# Patient Record
Sex: Female | Born: 1990 | Race: Black or African American | Hispanic: No | Marital: Single | Smoking: Never smoker
Health system: Southern US, Community
[De-identification: ages and names within clinical notes are randomized; demographics above are authoritative.]

## PROBLEM LIST (undated history)

## (undated) DIAGNOSIS — K219 Gastro-esophageal reflux disease without esophagitis: Secondary | ICD-10-CM

## (undated) DIAGNOSIS — F259 Schizoaffective disorder, unspecified: Secondary | ICD-10-CM

## (undated) DIAGNOSIS — F79 Unspecified intellectual disabilities: Secondary | ICD-10-CM

## (undated) HISTORY — PX: CHOLECYSTECTOMY: SHX55

---
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
## 2012-08-21 ENCOUNTER — Encounter (HOSPITAL_BASED_OUTPATIENT_CLINIC_OR_DEPARTMENT_OTHER): Payer: Self-pay | Admitting: *Deleted

## 2012-08-21 ENCOUNTER — Emergency Department (HOSPITAL_BASED_OUTPATIENT_CLINIC_OR_DEPARTMENT_OTHER)
Admission: EM | Admit: 2012-08-21 | Discharge: 2012-08-21 | Disposition: A | Discharge status: Home / Self Care | Payer: Medicaid Other | Attending: Emergency Medicine | Admitting: Emergency Medicine

## 2012-08-21 DIAGNOSIS — F259 Schizoaffective disorder, unspecified: Secondary | ICD-10-CM | POA: Insufficient documentation

## 2012-08-21 DIAGNOSIS — IMO0002 Reserved for concepts with insufficient information to code with codable children: Secondary | ICD-10-CM | POA: Insufficient documentation

## 2012-08-21 DIAGNOSIS — Y929 Unspecified place or not applicable: Secondary | ICD-10-CM | POA: Insufficient documentation

## 2012-08-21 DIAGNOSIS — Y9389 Activity, other specified: Secondary | ICD-10-CM | POA: Insufficient documentation

## 2012-08-21 DIAGNOSIS — T162XXA Foreign body in left ear, initial encounter: Secondary | ICD-10-CM

## 2012-08-21 DIAGNOSIS — F79 Unspecified intellectual disabilities: Secondary | ICD-10-CM | POA: Insufficient documentation

## 2012-08-21 DIAGNOSIS — Z79899 Other long term (current) drug therapy: Secondary | ICD-10-CM | POA: Insufficient documentation

## 2012-08-21 DIAGNOSIS — T169XXA Foreign body in ear, unspecified ear, initial encounter: Secondary | ICD-10-CM | POA: Insufficient documentation

## 2012-08-21 HISTORY — DX: Schizoaffective disorder, unspecified: F25.9

## 2012-08-21 HISTORY — DX: Unspecified intellectual disabilities: F79

## 2012-08-21 MED ORDER — LIDOCAINE HCL (CARDIAC) 20 MG/ML IV SOLN
INTRAVENOUS | Status: AC
  Administered 2012-08-21
  Filled 2012-08-21: qty 5

## 2012-08-21 NOTE — ED Notes (Signed)
Thinks she has a bug in her left ear.

## 2012-08-21 NOTE — ED Notes (Signed)
FB insect roach removed from left ear using suction device, pt denied pain or discomfort and insect removed w/o difficult pt remains pain free and exam of ear canal completed and no debris trauma or abnormality noted

## 2012-08-22 NOTE — ED Provider Notes (Signed)
History    CSN: [id number] Arrival date & time 08/21/12  2245  First MD Initiated Contact with Patient 08/21/12 2255     Chief Complaint  Patient presents with  . Foreign Body in Ear   (Consider location/radiation/quality/duration/timing/severity/associated sxs/prior Treatment) HPI Comments: Patient presents emergency department with chief complaint of "bug in the ear." She states that she can hear across and around. She denies any pain. She has not tried anything to alleviate her symptoms. Nothing makes his symptoms better or worse.  The history is provided by the patient. No language interpreter was used.   Past Medical History  Diagnosis Date  . Schizo-affective psychosis   . MR (mental retardation)    History reviewed. No pertinent past surgical history. No family history on file. History  Substance Use Topics  . Smoking status: Never Smoker   . Smokeless tobacco: Not on file  . Alcohol Use: No   OB History   Grav Para Term Preterm Abortions TAB SAB Ect Mult Living                 Review of Systems  All other systems reviewed and are negative.    Allergies  Review of patient's allergies indicates no known allergies.  Home Medications   Current Outpatient Rx  Name  Route  Sig  Dispense  Refill  . ARIPiprazole (ABILIFY PO)   Oral   Take by mouth.         Marland Kitchen FLUoxetine (PROZAC) 20 MG capsule   Oral   Take 20 mg by mouth daily.          BP 119/71  Pulse 72  Temp(Src) 98 F (36.7 C) (Oral)  Resp 20  Wt 250 lb (113.399 kg)  SpO2 99% Physical Exam  Nursing note and vitals reviewed. Constitutional: She is oriented to person, place, and time. She appears well-developed and well-nourished.  HENT:  Head: Normocephalic and atraumatic.  Foreign body is observed in left ear  Eyes: Conjunctivae and EOM are normal.  Neck: Normal range of motion.  Cardiovascular: Normal rate.   Pulmonary/Chest: Effort normal.  Abdominal: She exhibits no distension.   Musculoskeletal: Normal range of motion.  Neurological: She is alert and oriented to person, place, and time.  Skin: Skin is dry.  Psychiatric: She has a normal mood and affect. Her behavior is normal. Judgment and thought content normal.    ED Course  FOREIGN BODY REMOVAL Date/Time: 08/22/2012 12:45 AM Performed by: Roxy Horseman Authorized by: Roxy Horseman Consent: Verbal consent obtained. Risks and benefits: risks, benefits and alternatives were discussed Consent given by: patient Patient understanding: patient states understanding of the procedure being performed Patient consent: the patient's understanding of the procedure matches consent given Procedure consent: procedure consent matches procedure scheduled Relevant documents: relevant documents present and verified Test results: test results available and properly labeled Site marked: the operative site was marked Imaging studies: imaging studies available Required items: required blood products, implants, devices, and special equipment available Patient identity confirmed: verbally with patient Body area: ear Location details: left ear Patient sedated: no Patient restrained: no Patient cooperative: yes Localization method: visualized Removal mechanism: suction Complexity: simple 1 objects recovered. Objects recovered: Cock roach Post-procedure assessment: foreign body removed Patient tolerance: Patient tolerated the procedure well with no immediate complications.   (including critical care time)  Following foreign body removal, the ear was again inspected, and there is no evidence of any injury.  Labs Reviewed - No data to display No  results found. 1. Foreign body in ear, left, initial encounter     MDM  Patient with insect in her left ear. The bug was removed without complication. There is no damage to the tympanic membrane. Patient is stable and ready for discharge.  Roxy Horseman, PA-C 08/22/12  [phone]

## 2012-08-22 NOTE — ED Provider Notes (Signed)
Medical screening examination/treatment/procedure(s) were performed by non-physician practitioner and as supervising physician I was immediately available for consultation/collaboration.   Joya Gaskins, MD 08/22/12 0230

## 2013-10-14 ENCOUNTER — Encounter (HOSPITAL_BASED_OUTPATIENT_CLINIC_OR_DEPARTMENT_OTHER): Payer: Self-pay | Admitting: Emergency Medicine

## 2013-10-14 ENCOUNTER — Emergency Department (HOSPITAL_BASED_OUTPATIENT_CLINIC_OR_DEPARTMENT_OTHER): Payer: Federal, State, Local not specified - PPO

## 2013-10-14 ENCOUNTER — Emergency Department (HOSPITAL_BASED_OUTPATIENT_CLINIC_OR_DEPARTMENT_OTHER)
Admission: EM | Admit: 2013-10-14 | Discharge: 2013-10-14 | Disposition: A | Discharge status: Home / Self Care | Payer: Federal, State, Local not specified - PPO | Attending: Emergency Medicine | Admitting: Emergency Medicine

## 2013-10-14 DIAGNOSIS — F259 Schizoaffective disorder, unspecified: Secondary | ICD-10-CM | POA: Insufficient documentation

## 2013-10-14 DIAGNOSIS — R079 Chest pain, unspecified: Secondary | ICD-10-CM | POA: Insufficient documentation

## 2013-10-14 DIAGNOSIS — R0789 Other chest pain: Secondary | ICD-10-CM

## 2013-10-14 DIAGNOSIS — Z79899 Other long term (current) drug therapy: Secondary | ICD-10-CM | POA: Diagnosis not present

## 2013-10-14 LAB — COMPREHENSIVE METABOLIC PANEL
ALBUMIN: 3.5 g/dL (ref 3.5–5.2)
ALT: 11 U/L (ref 0–35)
AST: 15 U/L (ref 0–37)
Alkaline Phosphatase: 80 U/L (ref 39–117)
Anion gap: 12 (ref 5–15)
BUN: 8 mg/dL (ref 6–23)
CALCIUM: 9.6 mg/dL (ref 8.4–10.5)
CO2: 26 mEq/L (ref 19–32)
Chloride: 100 mEq/L (ref 96–112)
Creatinine, Ser: 0.8 mg/dL (ref 0.50–1.10)
GFR calc Af Amer: >90 mL/min (ref >90)
GFR calc non Af Amer: >90 mL/min (ref >90)
Glucose, Bld: 88 mg/dL (ref 70–99)
Potassium: 4 mEq/L (ref 3.7–5.3)
SODIUM: 138 meq/L (ref 137–147)
TOTAL PROTEIN: 7.5 g/dL (ref 6.0–8.3)
Total Bilirubin: 0.5 mg/dL (ref 0.3–1.2)

## 2013-10-14 LAB — CBC WITH DIFFERENTIAL/PLATELET
BASOS ABS: 0.1 10*3/uL (ref 0.0–0.1)
BASOS PCT: 1 % (ref 0–1)
EOS ABS: 0.2 10*3/uL (ref 0.0–0.7)
EOS PCT: 3 % (ref 0–5)
HCT: 36 % (ref 36.0–46.0)
Hemoglobin: 11.9 g/dL — ABNORMAL LOW (ref 12.0–15.0)
LYMPHS PCT: 34 % (ref 12–46)
Lymphs Abs: 2.6 10*3/uL (ref 0.7–4.0)
MCH: 28.2 pg (ref 26.0–34.0)
MCHC: 33.1 g/dL (ref 30.0–36.0)
MCV: 85.3 fL (ref 78.0–100.0)
Monocytes Absolute: 0.5 10*3/uL (ref 0.1–1.0)
Monocytes Relative: 7 % (ref 3–12)
Neutro Abs: 4.3 10*3/uL (ref 1.7–7.7)
Neutrophils Relative %: 55 % (ref 43–77)
PLATELETS: 301 10*3/uL (ref 150–400)
RBC: 4.22 MIL/uL (ref 3.87–5.11)
RDW: 12.7 % (ref 11.5–15.5)
WBC: 7.7 10*3/uL (ref 4.0–10.5)

## 2013-10-14 LAB — TROPONIN I: Troponin I: <0.3 ng/mL (ref <0.30)

## 2013-10-14 NOTE — ED Provider Notes (Signed)
CSN: [id number]     Arrival date & time 10/14/13  1141 History   First MD Initiated Contact with Patient 10/14/13 1207     Chief Complaint  Patient presents with  . Chest Pain     (Consider location/radiation/quality/duration/timing/severity/associated sxs/prior Treatment) Patient is a 23 y.o. female presenting with chest pain. The history is provided by the patient. A language interpreter was used.  Chest Pain Pain location:  L chest Pain quality: aching   Pain quality: not tearing and no tightness   Pain radiates to:  Does not radiate Pain radiates to the back: no   Pain severity:  Moderate Duration:  4 hours Timing:  Intermittent Progression:  Resolved Chronicity:  New Relieved by:  Nothing Worsened by:  Nothing tried Ineffective treatments:  None tried Associated symptoms: no headache and not vomiting     Past Medical History  Diagnosis Date  . Schizo-affective psychosis   . MR (mental retardation)    History reviewed. No pertinent past surgical history. No family history on file. History  Substance Use Topics  . Smoking status: Never Smoker   . Smokeless tobacco: Not on file  . Alcohol Use: No   OB History   Grav Para Term Preterm Abortions TAB SAB Ect Mult Living                 Review of Systems  Cardiovascular: Positive for chest pain.  Gastrointestinal: Negative for vomiting.  Neurological: Negative for headaches.  All other systems reviewed and are negative.     Allergies  Review of patient's allergies indicates no known allergies.  Home Medications   Prior to Admission medications   Medication Sig Start Date End Date Taking? Authorizing Provider  ferrous sulfate 325 (65 FE) MG tablet Take 325 mg by mouth daily with breakfast.   Yes Historical Provider, MD  Vitamin D, Ergocalciferol, (DRISDOL) 50000 UNITS CAPS capsule Take 50,000 Units by mouth every 7 (seven) days.   Yes Historical Provider, MD  ARIPiprazole (ABILIFY PO) Take by mouth.     Historical Provider, MD  FLUoxetine (PROZAC) 20 MG capsule Take 20 mg by mouth daily.    Historical Provider, MD   BP 107/70  Pulse 61  Temp(Src) 98.1 F (36.7 C) (Oral)  Resp 18  Ht  (1.702 m)  Wt 278 lb (126.1 kg)  BMI 43.53 kg/m2  SpO2 98%  LMP 10/14/2013 Physical Exam  Nursing note and vitals reviewed. Constitutional: She is oriented to person, place, and time. She appears well-developed and well-nourished.  HENT:  Head: Normocephalic and atraumatic.  Eyes: EOM are normal. Pupils are equal, round, and reactive to light.  Neck: Normal range of motion.  Pulmonary/Chest: Effort normal.  Abdominal: Soft. She exhibits no distension.  Musculoskeletal: Normal range of motion.  Neurological: She is alert and oriented to person, place, and time.  Skin: Skin is warm and dry.  Psychiatric: She has a normal mood and affect.    ED Course  Procedures (including critical care time) Labs Review Labs Reviewed  CBC WITH DIFFERENTIAL - Abnormal; Notable for the following:    Hemoglobin 11.9 (*)    All other components within normal limits  TROPONIN I  COMPREHENSIVE METABOLIC PANEL    Imaging Review Dg Chest 2 View  10/14/2013   CLINICAL DATA:  Chest pain  EXAM: CHEST  2 VIEW  COMPARISON:  None.  FINDINGS: The heart size and mediastinal contours are within normal limits. Both lungs are clear. The visualized skeletal structures  are unremarkable.  IMPRESSION: No active cardiopulmonary disease.   Electronically Signed   By: Britta Mccreedy M.D.   On: 10/14/2013 13:15     EKG Interpretation   Date/Time:  Sunday October 14 2013 12:10:18 EDT Ventricular Rate:  57 PR Interval:  156 QRS Duration: 86 QT Interval:  452 QTC Calculation: 439 R Axis:   45 Text Interpretation:  Sinus bradycardia with sinus arrhythmia Nonspecific  T wave abnormality Abnormal ECG No previous ECGs available Confirmed by  Manus Gunning  MD, STEPHEN [phone]) on 10/14/2013 12:17:35 PM     Results for orders  placed during the hospital encounter of 10/14/13  TROPONIN I      Result Value Ref Range   Troponin I <0.30  <0.30 ng/mL  CBC WITH DIFFERENTIAL      Result Value Ref Range   WBC 7.7  4.0 - 10.5 K/uL   RBC 4.22  3.87 - 5.11 MIL/uL   Hemoglobin 11.9 (*) 12.0 - 15.0 g/dL   HCT 19.1  47.8 - 29.5 %   MCV 85.3  78.0 - 100.0 fL   MCH 28.2  26.0 - 34.0 pg   MCHC 33.1  30.0 - 36.0 g/dL   RDW 62.1  30.8 - 65.7 %   Platelets 301  150 - 400 K/uL   Neutrophils Relative % 55  43 - 77 %   Neutro Abs 4.3  1.7 - 7.7 K/uL   Lymphocytes Relative 34  12 - 46 %   Lymphs Abs 2.6  0.7 - 4.0 K/uL   Monocytes Relative 7  3 - 12 %   Monocytes Absolute 0.5  0.1 - 1.0 K/uL   Eosinophils Relative 3  0 - 5 %   Eosinophils Absolute 0.2  0.0 - 0.7 K/uL   Basophils Relative 1  0 - 1 %   Basophils Absolute 0.1  0.0 - 0.1 K/uL  COMPREHENSIVE METABOLIC PANEL      Result Value Ref Range   Sodium 138  137 - 147 mEq/L   Potassium 4.0  3.7 - 5.3 mEq/L   Chloride 100  96 - 112 mEq/L   CO2 26  19 - 32 mEq/L   Glucose, Bld 88  70 - 99 mg/dL   BUN 8  6 - 23 mg/dL   Creatinine, Ser 8.46  0.50 - 1.10 mg/dL   Calcium 9.6  8.4 - 96.2 mg/dL   Total Protein 7.5  6.0 - 8.3 g/dL   Albumin 3.5  3.5 - 5.2 g/dL   AST 15  0 - 37 U/L   ALT 11  0 - 35 U/L   Alkaline Phosphatase 80  39 - 117 U/L   Total Bilirubin 0.5  0.3 - 1.2 mg/dL   GFR calc non Af Amer >90  >90 mL/min   GFR calc Af Amer >90  >90 mL/min   Anion gap 12  5 - 15   Dg Chest 2 View  10/14/2013   CLINICAL DATA:  Chest pain  EXAM: CHEST  2 VIEW  COMPARISON:  None.  FINDINGS: The heart size and mediastinal contours are within normal limits. Both lungs are clear. The visualized skeletal structures are unremarkable.  IMPRESSION: No active cardiopulmonary disease.   Electronically Signed   By: Britta Mccreedy M.D.   On: 10/14/2013 13:15    MDM Pt is being seen by cardiology.  Pain is resolved.   Troponin negative,  I doubt cardiac etiology today   Final diagnoses:   Chest pain of  uncertain etiology    Pt advised tylenol for fever    Elson Areas, PA-C 10/14/13 1435

## 2013-10-14 NOTE — Discharge Instructions (Signed)

## 2013-10-14 NOTE — ED Provider Notes (Signed)
Medical screening examination/treatment/procedure(s) were performed by non-physician practitioner and as supervising physician I was immediately available for consultation/collaboration.   EKG Interpretation   Date/Time:  Sunday October 14 2013 12:10:18 EDT Ventricular Rate:  57 PR Interval:  156 QRS Duration: 86 QT Interval:  452 QTC Calculation: 439 R Axis:   45 Text Interpretation:  Sinus bradycardia with sinus arrhythmia Nonspecific  T wave abnormality Abnormal ECG No previous ECGs available Confirmed by  Manus Gunning  MD, Darwyn Ponzo [phone]) on 10/14/2013 12:17:35 PM        Glynn Octave, MD 10/14/13 [phone]

## 2013-10-14 NOTE — ED Notes (Signed)
Patient states that at appx 10am she began having chest pain, described as a burning sensation in her upper mid chest. Pt also c/o throat pain and burning that she states started at the same time.

## 2014-08-15 ENCOUNTER — Emergency Department (HOSPITAL_BASED_OUTPATIENT_CLINIC_OR_DEPARTMENT_OTHER): Payer: Medicaid Other

## 2014-08-15 ENCOUNTER — Encounter (HOSPITAL_BASED_OUTPATIENT_CLINIC_OR_DEPARTMENT_OTHER): Payer: Self-pay | Admitting: *Deleted

## 2014-08-15 ENCOUNTER — Emergency Department (HOSPITAL_BASED_OUTPATIENT_CLINIC_OR_DEPARTMENT_OTHER)
Admission: EM | Admit: 2014-08-15 | Discharge: 2014-08-15 | Disposition: A | Discharge status: Home / Self Care | Payer: Medicaid Other | Attending: Emergency Medicine | Admitting: Emergency Medicine

## 2014-08-15 DIAGNOSIS — R109 Unspecified abdominal pain: Secondary | ICD-10-CM | POA: Insufficient documentation

## 2014-08-15 DIAGNOSIS — Z79899 Other long term (current) drug therapy: Secondary | ICD-10-CM | POA: Diagnosis not present

## 2014-08-15 DIAGNOSIS — Z3202 Encounter for pregnancy test, result negative: Secondary | ICD-10-CM | POA: Diagnosis not present

## 2014-08-15 DIAGNOSIS — F79 Unspecified intellectual disabilities: Secondary | ICD-10-CM | POA: Insufficient documentation

## 2014-08-15 DIAGNOSIS — Z87442 Personal history of urinary calculi: Secondary | ICD-10-CM | POA: Insufficient documentation

## 2014-08-15 LAB — URINALYSIS, ROUTINE W REFLEX MICROSCOPIC
Bilirubin Urine: NEGATIVE
Glucose, UA: NEGATIVE mg/dL
KETONES UR: NEGATIVE mg/dL
LEUKOCYTES UA: NEGATIVE
NITRITE: NEGATIVE
PROTEIN: NEGATIVE mg/dL
SPECIFIC GRAVITY, URINE: 1.03 (ref 1.005–1.030)
Urobilinogen, UA: 1 mg/dL (ref 0.0–1.0)
pH: 6 (ref 5.0–8.0)

## 2014-08-15 LAB — PREGNANCY, URINE: Preg Test, Ur: NEGATIVE

## 2014-08-15 LAB — URINE MICROSCOPIC-ADD ON

## 2014-08-15 MED ORDER — ONDANSETRON 8 MG PO TBDP: 8.0000 mg | ORAL_TABLET | Freq: Three times a day (TID) | ORAL | Status: DC | PRN

## 2014-08-15 MED ORDER — HYDROCODONE-ACETAMINOPHEN 5-325 MG PO TABS: 1.0000 | ORAL_TABLET | Freq: Four times a day (QID) | ORAL | Status: DC | PRN

## 2014-08-15 NOTE — ED Notes (Signed)
Pt c/o right flank pain x 2 days.  

## 2014-08-15 NOTE — ED Notes (Signed)
Patient transported to CT condition stable

## 2014-08-15 NOTE — ED Provider Notes (Signed)
CSN: [id number]     Arrival date & time 08/15/14  0017 History   First MD Initiated Contact with Patient 08/15/14 [phone]     Chief Complaint  Patient presents with  . Flank Pain     (Consider location/radiation/quality/duration/timing/severity/associated sxs/prior Treatment) HPI  Level 5 Caveat: mentally retarded. This is a 24 year old female with a two-day history of right flank pain acutely worsened late yesterday evening. The pain was severe enough to have her crying at its worst. She also had nausea and vomiting associated with it. She describes the pain as feeling like her right flank is swollen. She has not taken any pain medication for this. Her pain is improved but is still present. She denies abdominal pain. She has a history of kidney stones.  Past Medical History  Diagnosis Date  . Schizo-affective psychosis   . MR (mental retardation)    History reviewed. No pertinent past surgical history. History reviewed. No pertinent family history. History  Substance Use Topics  . Smoking status: Never Smoker   . Smokeless tobacco: Not on file  . Alcohol Use: No   OB History    No data available     Review of Systems  Unable to perform ROS   Allergies  Review of patient's allergies indicates no known allergies.  Home Medications   Prior to Admission medications   Medication Sig Start Date End Date Taking? Authorizing Provider  ARIPiprazole (ABILIFY PO) Take by mouth.    Historical Provider, MD  ferrous sulfate 325 (65 FE) MG tablet Take 325 mg by mouth daily with breakfast.    Historical Provider, MD  FLUoxetine (PROZAC) 20 MG capsule Take 20 mg by mouth daily.    Historical Provider, MD  Vitamin D, Ergocalciferol, (DRISDOL) 50000 UNITS CAPS capsule Take 50,000 Units by mouth every 7 (seven) days.    Historical Provider, MD   BP 112/48 mmHg  Pulse 70  Temp(Src) 97.8 F (36.6 C) (Oral)  Resp 18  Ht  (1.676 m)  Wt 270 lb (122.471 kg)  BMI 43.60 kg/m2  SpO2 98%   LMP 07/27/2014   Physical Exam  General: Well-developed, well-nourished female in no acute distress; appearance consistent with age of record HENT: normocephalic; atraumatic Eyes: pupils equal, round and reactive to light; extraocular muscles intact Neck: supple Heart: regular rate and rhythm Lungs: clear to auscultation bilaterally Abdomen: soft; nondistended; nontender; bowel sounds present GU: No CVA tenderness Extremities: No deformity; full range of motion; pulses normal Neurologic: Awake, alert; motor function intact in all extremities and symmetric; no facial droop Skin: Warm and dry Psychiatric: Normal mood and affect    ED Course  Procedures (including critical care time)   MDM   Nursing notes and vitals signs, including pulse oximetry, reviewed.  Summary of this visit's results, reviewed by myself:  Labs:  Results for orders placed or performed during the hospital encounter of 08/15/14 (from the past 24 hour(s))  Pregnancy, urine     Status: None   Collection Time: 08/15/14 12:30 AM  Result Value Ref Range   Preg Test, Ur NEGATIVE NEGATIVE  Urinalysis, Routine w reflex microscopic (not at [facility])     Status: Abnormal   Collection Time: 08/15/14 12:30 AM  Result Value Ref Range   Color, Urine YELLOW YELLOW   APPearance CLOUDY (A) CLEAR   Specific Gravity, Urine 1.030 1.005 - 1.030   pH 6.0 5.0 - 8.0   Glucose, UA NEGATIVE NEGATIVE mg/dL   Hgb urine dipstick SMALL (A)  NEGATIVE   Bilirubin Urine NEGATIVE NEGATIVE   Ketones, ur NEGATIVE NEGATIVE mg/dL   Protein, ur NEGATIVE NEGATIVE mg/dL   Urobilinogen, UA 1.0 0.0 - 1.0 mg/dL   Nitrite NEGATIVE NEGATIVE   Leukocytes, UA NEGATIVE NEGATIVE  Urine microscopic-add on     Status: Abnormal   Collection Time: 08/15/14 12:30 AM  Result Value Ref Range   Squamous Epithelial / LPF FEW (A) RARE   WBC, UA 3-6 <3 WBC/hpf   RBC / HPF 7-10 <3 RBC/hpf   Bacteria, UA MANY (A) RARE   Urine-Other MUCOUS PRESENT      Imaging Studies: Ct Renal Stone Study  08/15/2014   CLINICAL DATA:  Right flank pain for 2 days.  EXAM: CT ABDOMEN AND PELVIS WITHOUT CONTRAST  TECHNIQUE: Multidetector CT imaging of the abdomen and pelvis was performed following the standard protocol without IV contrast.  COMPARISON:  04/01/2014  FINDINGS: Lung bases are clear.  Kidneys are symmetrical in size and shape. No hydronephrosis or hydroureter. No renal, ureteral, or bladder stones.  Unenhanced appearance of the liver, spleen, gallbladder, pancreas, adrenal glands, abdominal aorta, inferior vena cava, and retroperitoneal lymph nodes is unremarkable. Small accessory spleen. Stomach, small bowel, and colon are not abnormally distended. No free air or free fluid in the abdomen. Abdominal wall musculature appears intact.  Pelvis: Appendix is normal. Uterus and ovaries are not enlarged. Mild bladder wall thickening may indicate cystitis. No pelvic mass or lymphadenopathy. No free or loculated pelvic fluid collections. No destructive bone lesions.  IMPRESSION: No renal or ureteral stone or obstruction. Mild bladder wall thickening may indicate cystitis.   Electronically Signed   By: Burman NievesWilliam  Stevens M.D.   On: 08/15/2014 03:12       Paula LibraJohn Skyeler Scalese, MD 08/15/14 [phone]

## 2015-03-21 DIAGNOSIS — F259 Schizoaffective disorder, unspecified: Secondary | ICD-10-CM | POA: Insufficient documentation

## 2015-03-21 DIAGNOSIS — F78A9 Other genetic related intellectual disability: Secondary | ICD-10-CM | POA: Insufficient documentation

## 2015-03-21 DIAGNOSIS — G40909 Epilepsy, unspecified, not intractable, without status epilepticus: Secondary | ICD-10-CM | POA: Insufficient documentation

## 2015-03-21 DIAGNOSIS — K219 Gastro-esophageal reflux disease without esophagitis: Secondary | ICD-10-CM | POA: Insufficient documentation

## 2017-02-06 ENCOUNTER — Encounter (HOSPITAL_BASED_OUTPATIENT_CLINIC_OR_DEPARTMENT_OTHER): Payer: Self-pay | Admitting: Emergency Medicine

## 2017-02-06 ENCOUNTER — Other Ambulatory Visit: Payer: Self-pay

## 2017-02-06 ENCOUNTER — Emergency Department (HOSPITAL_BASED_OUTPATIENT_CLINIC_OR_DEPARTMENT_OTHER)
Admission: EM | Admit: 2017-02-06 | Discharge: 2017-02-06 | Disposition: A | Discharge status: Home / Self Care | Payer: Medicaid Other | Attending: Emergency Medicine | Admitting: Emergency Medicine

## 2017-02-06 ENCOUNTER — Emergency Department (HOSPITAL_BASED_OUTPATIENT_CLINIC_OR_DEPARTMENT_OTHER): Payer: Medicaid Other

## 2017-02-06 DIAGNOSIS — J209 Acute bronchitis, unspecified: Secondary | ICD-10-CM | POA: Insufficient documentation

## 2017-02-06 DIAGNOSIS — Z79899 Other long term (current) drug therapy: Secondary | ICD-10-CM | POA: Insufficient documentation

## 2017-02-06 DIAGNOSIS — R05 Cough: Secondary | ICD-10-CM | POA: Diagnosis present

## 2017-02-06 DIAGNOSIS — J40 Bronchitis, not specified as acute or chronic: Secondary | ICD-10-CM

## 2017-02-06 LAB — CBC WITH DIFFERENTIAL/PLATELET
Basophils Absolute: 0 10*3/uL (ref 0.0–0.1)
Basophils Relative: 1 %
EOS ABS: 0.6 10*3/uL (ref 0.0–0.7)
Eosinophils Relative: 8 %
HCT: 33.5 % — ABNORMAL LOW (ref 36.0–46.0)
HEMOGLOBIN: 10.8 g/dL — AB (ref 12.0–15.0)
LYMPHS ABS: 2.7 10*3/uL (ref 0.7–4.0)
Lymphocytes Relative: 33 %
MCH: 27 pg (ref 26.0–34.0)
MCHC: 32.2 g/dL (ref 30.0–36.0)
MCV: 83.8 fL (ref 78.0–100.0)
MONOS PCT: 7 %
Monocytes Absolute: 0.6 10*3/uL (ref 0.1–1.0)
NEUTROS PCT: 51 %
Neutro Abs: 4.2 10*3/uL (ref 1.7–7.7)
Platelets: 300 10*3/uL (ref 150–400)
RBC: 4 MIL/uL (ref 3.87–5.11)
RDW: 13.4 % (ref 11.5–15.5)
WBC: 8.1 10*3/uL (ref 4.0–10.5)

## 2017-02-06 LAB — BASIC METABOLIC PANEL
Anion gap: 7 (ref 5–15)
BUN: 9 mg/dL (ref 6–20)
CHLORIDE: 104 mmol/L (ref 101–111)
CO2: 25 mmol/L (ref 22–32)
CREATININE: 0.92 mg/dL (ref 0.44–1.00)
Calcium: 8.6 mg/dL — ABNORMAL LOW (ref 8.9–10.3)
GFR calc non Af Amer: >60 mL/min (ref >60)
Glucose, Bld: 97 mg/dL (ref 65–99)
Potassium: 3.4 mmol/L — ABNORMAL LOW (ref 3.5–5.1)
Sodium: 136 mmol/L (ref 135–145)

## 2017-02-06 LAB — TROPONIN I

## 2017-02-06 LAB — D-DIMER, QUANTITATIVE: D-Dimer, Quant: 0.38 ug/mL-FEU (ref 0.00–0.50)

## 2017-02-06 MED ORDER — IPRATROPIUM-ALBUTEROL 0.5-2.5 (3) MG/3ML IN SOLN
3.0000 mL | Freq: Once | RESPIRATORY_TRACT | Status: AC
  Administered 2017-02-06: 3 mL via RESPIRATORY_TRACT
  Filled 2017-02-06: qty 3

## 2017-02-06 MED ORDER — BENZONATATE 100 MG PO CAPS: 100.0000 mg | ORAL_CAPSULE | Freq: Three times a day (TID) | ORAL | 0 refills | Status: DC

## 2017-02-06 MED ORDER — PREDNISONE 20 MG PO TABS: 40.0000 mg | ORAL_TABLET | Freq: Every day | ORAL | 0 refills | Status: DC

## 2017-02-06 MED ORDER — BENZONATATE 100 MG PO CAPS
100.0000 mg | ORAL_CAPSULE | Freq: Once | ORAL | Status: AC
  Administered 2017-02-06: 100 mg via ORAL
  Filled 2017-02-06: qty 1

## 2017-02-06 MED ORDER — PREDNISONE 20 MG PO TABS
40.0000 mg | ORAL_TABLET | Freq: Once | ORAL | Status: AC
  Administered 2017-02-06: 40 mg via ORAL
  Filled 2017-02-06: qty 2

## 2017-02-06 MED ORDER — ALBUTEROL SULFATE HFA 108 (90 BASE) MCG/ACT IN AERS: 1.0000 | INHALATION_SPRAY | Freq: Four times a day (QID) | RESPIRATORY_TRACT | 0 refills | Status: DC | PRN

## 2017-02-06 NOTE — ED Provider Notes (Signed)
[facility] EMERGENCY DEPARTMENT Provider Note   CSN: [id number] Arrival date & time: 02/06/17  1133     History   Chief Complaint Chief Complaint  Patient presents with  . Cough    HPI Melissa Griffin is a 27 y.o. female.  HPI 27 year old African-American female past medical history significant for schizoaffective psychosis and mental retardation presents to the emergency department today for evaluation of cough, wheezing, chest pain and chest tightness.  Patient states that last night she started developing wheezing and a nonproductive cough.  She also states that her chest has become sore from coughing.  She also reports that her breathing is very tight.  Patient has not taking for his symptoms prior to arrival.  Nothing makes better or worse.  Patient denies any history of DVT/PE, prolonged immobilization, recent hospitalization/surgery, unilateral leg swelling or calf tenderness, hormone use, hemoptysis.  Patient denies any cardiac history.  Patient also reports associated rhinorrhea and sneezing.  She denies any associated fever, chills, otalgia, sore throat.   Pt denies any fever, chill, ha, vision changes, lightheadedness, dizziness, congestion, neck pain, abd pain, n/v/d, urinary symptoms, change in bowel habits, melena, hematochezia, lower extremity paresthesias.  Past Medical History:  Diagnosis Date  . MR (mental retardation)   . Schizo-affective psychosis (HCC)     There are no active problems to display for this patient.   Past Surgical History:  Procedure Laterality Date  . CHOLECYSTECTOMY      OB History    No data available       Home Medications    Prior to Admission medications   Medication Sig Start Date End Date Taking? Authorizing Provider  ARIPiprazole (ABILIFY PO) Take by mouth.   Yes [provider]  ferrous sulfate 325 (65 FE) MG tablet Take 325 mg by mouth daily with breakfast.   Yes [provider]  FLUoxetine  (PROZAC) 20 MG capsule Take 20 mg by mouth daily.   Yes [provider]  Vitamin D, Ergocalciferol, (DRISDOL) 50000 UNITS CAPS capsule Take 50,000 Units by mouth every 7 (seven) days.   Yes [provider]  albuterol (PROVENTIL HFA;VENTOLIN HFA) 108 (90 Base) MCG/ACT inhaler Inhale 1-2 puffs into the lungs every 6 (six) hours as needed for wheezing or shortness of breath. 02/06/17   Rise Mu, PA-C  benzonatate (TESSALON) 100 MG capsule Take 1 capsule (100 mg total) by mouth every 8 (eight) hours. 02/06/17   Rise Mu, PA-C  HYDROcodone-acetaminophen (NORCO) 5-325 MG per tablet Take 1-2 tablets by mouth every 6 (six) hours as needed (for pain). 08/15/14   Molpus, Jonny Ruiz, MD  ondansetron (ZOFRAN ODT) 8 MG disintegrating tablet Take 1 tablet (8 mg total) by mouth every 8 (eight) hours as needed for nausea or vomiting. 08/15/14   Molpus, Jonny Ruiz, MD  predniSONE (DELTASONE) 20 MG tablet Take 2 tablets (40 mg total) by mouth daily with breakfast. 02/06/17   Leaphart, Lynann Beaver, PA-C    Family History No family history on file.  Social History Social History   Tobacco Use  . Smoking status: Never Smoker  . Smokeless tobacco: Never Used  Substance Use Topics  . Alcohol use: No  . Drug use: No     Allergies   Patient has no known allergies.   Review of Systems Review of Systems  Constitutional: Negative for chills, diaphoresis and fever.  HENT: Negative for congestion.   Eyes: Negative for visual disturbance.  Respiratory: Positive for cough, chest tightness, shortness  of breath and wheezing.   Cardiovascular: Positive for chest pain (chest wall). Negative for palpitations and leg swelling.  Gastrointestinal: Negative for abdominal pain, diarrhea, nausea and vomiting.  Genitourinary: Negative for dysuria, flank pain, frequency, hematuria and urgency.  Musculoskeletal: Negative for arthralgias and myalgias.  Skin: Negative for rash.  Neurological: Negative for  dizziness, syncope, weakness, light-headedness, numbness and headaches.  Psychiatric/Behavioral: Negative for sleep disturbance. The patient is not nervous/anxious.      Physical Exam Updated Vital Signs BP (!) 101/58 (BP Location: Left Arm)   Pulse 92   Temp 98.8 F (37.1 C) (Oral)   Resp 20   Ht 5\' 7"  (1.702 m)   Wt 127.5 kg (281 lb)   LMP 01/30/2017   SpO2 99%   BMI 44.01 kg/m   Physical Exam  Constitutional: She is oriented to person, place, and time. She appears well-developed and well-nourished.  Non-toxic appearance. No distress.  HENT:  Head: Normocephalic and atraumatic.  Nose: Nose normal.  Mouth/Throat: Oropharynx is clear and moist.  Eyes: Conjunctivae are normal. Pupils are equal, round, and reactive to light. Right eye exhibits no discharge. Left eye exhibits no discharge.  Neck: Normal range of motion. Neck supple. No JVD present. No tracheal deviation present.  Cardiovascular: Normal rate, regular rhythm, normal heart sounds and intact distal pulses. Exam reveals no gallop and no friction rub.  No murmur heard. Pulmonary/Chest: Effort normal. No stridor. No respiratory distress. She has wheezes (insporatory and expiratory). She has no rales. She exhibits no tenderness.  No hypoxia or tachypnea.  Rhonchorous sounds that clear with cough.  Chest wall tenderness to the anterior chest to palpation.  Abdominal: Soft. Bowel sounds are normal. She exhibits no distension. There is no tenderness. There is no rebound and no guarding.  Musculoskeletal: Normal range of motion.  No lower extremity edema or calf tenderness.  Lymphadenopathy:    She has no cervical adenopathy.  Neurological: She is alert and oriented to person, place, and time.  Skin: Skin is warm and dry. Capillary refill takes less than 2 seconds. She is not diaphoretic.  Psychiatric: Her behavior is normal. Judgment and thought content normal.  Nursing note and vitals reviewed.    ED Treatments /  Results  Labs (all labs ordered are listed, but only abnormal results are displayed) Labs Reviewed  BASIC METABOLIC PANEL - Abnormal; Notable for the following components:      Result Value   Potassium 3.4 (*)    Calcium 8.6 (*)    All other components within normal limits  CBC WITH DIFFERENTIAL/PLATELET - Abnormal; Notable for the following components:   Hemoglobin 10.8 (*)    HCT 33.5 (*)    All other components within normal limits  TROPONIN I  D-DIMER, QUANTITATIVE (NOT AT [facility])    EKG  EKG Interpretation  Date/Time:  Sunday February 06 2017 12:39:25 EST Ventricular Rate:  73 PR Interval:    QRS Duration: 87 QT Interval:  391 QTC Calculation: 431 R Axis:   46 Text Interpretation:  Sinus rhythm Low voltage, precordial leads No acute changes No significant change since last tracing Confirmed by Derwood Kaplan [phone]) on 02/06/2017 1:13:42 PM       Radiology Dg Chest 2 View  Result Date: 02/06/2017 CLINICAL DATA:  Cough and congestion since last night, wheezing, anterior chest pain at times EXAM: CHEST  2 VIEW COMPARISON:  07/07/2014 FINDINGS: Normal heart size, mediastinal contours, and pulmonary vascularity. Lungs clear. No pleural effusion or pneumothorax. Bones  unremarkable. IMPRESSION: Normal exam. Electronically Signed   By: Ulyses SouthwardMark  Boles M.D.   On: 02/06/2017 12:33    Procedures Procedures (including critical care time)  Medications Ordered in ED Medications  ipratropium-albuterol (DUONEB) 0.5-2.5 (3) MG/3ML nebulizer solution 3 mL (3 mLs Nebulization Given 02/06/17 1240)  predniSONE (DELTASONE) tablet 40 mg (40 mg Oral Given 02/06/17 1252)  benzonatate (TESSALON) capsule 100 mg (100 mg Oral Given 02/06/17 1253)  ipratropium-albuterol (DUONEB) 0.5-2.5 (3) MG/3ML nebulizer solution 3 mL (3 mLs Nebulization Given 02/06/17 1417)     Initial Impression / Assessment and Plan / ED Course  I have reviewed the triage vital signs and the nursing notes.  Pertinent labs & imaging  results that were available during my care of the patient were reviewed by me and considered in my medical decision making (see chart for details).     Patient presents to the ED for evaluation of cough, wheezing, chest tightness and chest wall pain secondary to cough.  Patient has no PE risk factors however she is mildly hypoxic and tachycardic in the ED.  Unable to rule out patient with PERC rule.  Patient is overall well-appearing and nontoxic.  Her vital signs are overall reassuring.  She is afebrile.  On exam she does have rhonchorous sounds that clear with cough along with inspiratory and expiratory wheezing.  Initial lab work was obtained.  Patient has no leukocytosis.  Hemoglobin appears at patient's baseline.  Her d-dimer is negative.  Chest x-ray reveals no acute abnormalities. EKG shows no ischemic changes.  Clinical presentation does not seem consistent with PE, ACS, dissection.  Patient symptoms seem consistent with viral bronchitis.  Patient given DuoNeb in the ED along with steroids and cough medicine.  The patient feels like her breathing has improved.  Patient is able to ambulate around the department with saturations at 100%.  On repeat assessment breath sounds have improved.  Patient will be discharged home with cough medicine, albuterol inhaler and steroid burst.  Instructed patient to follow-up with her primary care doctor or return to the ED with any worsening symptoms.  Pt is hemodynamically stable, in NAD, & able to ambulate in the ED. Evaluation does not show pathology that would require ongoing emergent intervention or inpatient treatment. I explained the diagnosis to the patient. Pain has been managed & has no complaints prior to dc. Pt is comfortable with above plan and is stable for discharge at this time. All questions were answered prior to disposition. Strict return precautions for f/u to the ED were discussed. Encouraged follow up with PCP.   Final Clinical  Impressions(s) / ED Diagnoses   Final diagnoses:  Bronchitis    ED Discharge Orders        Ordered    albuterol (PROVENTIL HFA;VENTOLIN HFA) 108 (90 Base) MCG/ACT inhaler  Every 6 hours PRN     02/06/17 1523    benzonatate (TESSALON) 100 MG capsule  Every 8 hours     02/06/17 1523    predniSONE (DELTASONE) 20 MG tablet  Daily with breakfast     02/06/17 1523       Rise MuLeaphart, Kenneth T, PA-C 02/06/17 1949    Derwood KaplanNanavati, Ankit, MD 02/07/17 [phone]

## 2017-02-06 NOTE — ED Triage Notes (Signed)
Pt reports coughing and wheezing since last night.

## 2017-02-06 NOTE — ED Notes (Signed)
Pt and FM given d/c instructions as per chart. Rx x 3. Verbalizes understanding. No questions.

## 2017-02-06 NOTE — Discharge Instructions (Signed)
Your symptoms seem consistent with a viral bronchitis.  Take the Tessalon for cough.  Use the inhaler as needed for shortness of breath and wheezing.  Take the prednisone starting tomorrow for the next 3 days to complete a course of 4 days total.  Follow-up with your primary care doctor.  Return to the ED with any worsening symptoms.

## 2017-10-08 ENCOUNTER — Other Ambulatory Visit: Payer: Self-pay

## 2017-10-08 ENCOUNTER — Encounter (HOSPITAL_BASED_OUTPATIENT_CLINIC_OR_DEPARTMENT_OTHER): Payer: Self-pay | Admitting: *Deleted

## 2017-10-08 ENCOUNTER — Emergency Department (HOSPITAL_BASED_OUTPATIENT_CLINIC_OR_DEPARTMENT_OTHER)
Admission: EM | Admit: 2017-10-08 | Discharge: 2017-10-08 | Disposition: A | Discharge status: Home / Self Care | Payer: Medicaid Other | Attending: Emergency Medicine | Admitting: Emergency Medicine

## 2017-10-08 DIAGNOSIS — Y9389 Activity, other specified: Secondary | ICD-10-CM | POA: Diagnosis not present

## 2017-10-08 DIAGNOSIS — R300 Dysuria: Secondary | ICD-10-CM | POA: Insufficient documentation

## 2017-10-08 DIAGNOSIS — X58XXXA Exposure to other specified factors, initial encounter: Secondary | ICD-10-CM | POA: Insufficient documentation

## 2017-10-08 DIAGNOSIS — T162XXA Foreign body in left ear, initial encounter: Secondary | ICD-10-CM | POA: Diagnosis not present

## 2017-10-08 DIAGNOSIS — Z79899 Other long term (current) drug therapy: Secondary | ICD-10-CM | POA: Diagnosis not present

## 2017-10-08 DIAGNOSIS — R350 Frequency of micturition: Secondary | ICD-10-CM | POA: Insufficient documentation

## 2017-10-08 DIAGNOSIS — Y929 Unspecified place or not applicable: Secondary | ICD-10-CM | POA: Insufficient documentation

## 2017-10-08 DIAGNOSIS — N3001 Acute cystitis with hematuria: Secondary | ICD-10-CM | POA: Insufficient documentation

## 2017-10-08 DIAGNOSIS — Y998 Other external cause status: Secondary | ICD-10-CM | POA: Insufficient documentation

## 2017-10-08 DIAGNOSIS — M545 Low back pain: Secondary | ICD-10-CM | POA: Insufficient documentation

## 2017-10-08 DIAGNOSIS — H9202 Otalgia, left ear: Secondary | ICD-10-CM | POA: Diagnosis present

## 2017-10-08 LAB — URINALYSIS, ROUTINE W REFLEX MICROSCOPIC
Bilirubin Urine: NEGATIVE
GLUCOSE, UA: NEGATIVE mg/dL
Ketones, ur: NEGATIVE mg/dL
Leukocytes, UA: NEGATIVE
Nitrite: NEGATIVE
PROTEIN: NEGATIVE mg/dL
Specific Gravity, Urine: >1.03 — ABNORMAL HIGH (ref 1.005–1.030)
pH: 5.5 (ref 5.0–8.0)

## 2017-10-08 LAB — URINALYSIS, MICROSCOPIC (REFLEX)

## 2017-10-08 LAB — PREGNANCY, URINE: PREG TEST UR: NEGATIVE

## 2017-10-08 MED ORDER — LIDOCAINE HCL (PF) 1 % IJ SOLN
5.0000 mL | Freq: Once | INTRAMUSCULAR | Status: AC
  Administered 2017-10-08: 5 mL
  Filled 2017-10-08: qty 5

## 2017-10-08 MED ORDER — CEPHALEXIN 500 MG PO CAPS: 500.0000 mg | ORAL_CAPSULE | Freq: Two times a day (BID) | ORAL | 0 refills | Status: AC

## 2017-10-08 MED ORDER — CEPHALEXIN 250 MG PO CAPS
500.0000 mg | ORAL_CAPSULE | Freq: Once | ORAL | Status: AC
  Administered 2017-10-08: 500 mg via ORAL
  Filled 2017-10-08: qty 2

## 2017-10-08 MED ORDER — ONDANSETRON 4 MG PO TBDP
4.0000 mg | ORAL_TABLET | Freq: Once | ORAL | Status: AC
  Administered 2017-10-08: 4 mg via ORAL
  Filled 2017-10-08: qty 1

## 2017-10-08 NOTE — Discharge Instructions (Addendum)
Please schedule an appointment with your primary care doctor on either Monday or Tuesday to have them look in your ear and to reevaluate your back pain.   Please take Ibuprofen (Advil, motrin) and Tylenol (acetaminophen) to relieve your pain.  You may take up to 600 MG (3 pills) of normal strength ibuprofen every 8 hours as needed.  In between doses of ibuprofen you make take tylenol, up to 1,000 mg (two extra strength pills).  Do not take more than 3,000 mg tylenol in a 24 hour period.  Please check all medication labels as many medications such as pain and cold medications may contain tylenol.  Do not drink alcohol while taking these medications.  Do not take other NSAID'S while taking ibuprofen (such as aleve or naproxen).  Please take ibuprofen with food to decrease stomach upset.]  You may have diarrhea from the antibiotics.  It is very important that you continue to take the antibiotics even if you get diarrhea unless a medical professional tells you that you may stop taking them.  If you stop too early the bacteria you are being treated for will become stronger and you may need different, more powerful antibiotics that have more side effects and worsening diarrhea.  Please stay well hydrated and consider probiotics as they may decrease the severity of your diarrhea.  Please be aware that if you take any hormonal contraception (birth control pills, nexplanon, the ring, etc) that your birth control will not work while you are taking antibiotics and you need to use back up protection as directed on the birth control medication information insert.

## 2017-10-08 NOTE — ED Notes (Signed)
Pt's father reports that pt is vomiting. PA aware and orders received.

## 2017-10-08 NOTE — ED Triage Notes (Signed)
Pt c/o left ear pain and left side lower back pain x 1 day. Pt ambulated to triage without need for assist

## 2017-10-08 NOTE — ED Provider Notes (Signed)
[facility] EMERGENCY DEPARTMENT Provider Note   CSN: [id number] Arrival date & time: 10/08/17  1950     History   Chief Complaint Chief Complaint  Patient presents with  . Otalgia  . Back Pain    HPI Melissa Griffin is a 27 y.o. female with a past medical history of cognitive delays, schizoaffective psychosis, who presents today for evaluation of 2 complaints. 1.  Left ear pain.  She reports that since today her left ear has been hurting her.  She denies any nasal congestion, sore throat.  She has not had any fevers recently. 2.  Urinary frequency.  She reports that today she has had increased urinary frequency.  She endorses dysuria.  She has slight right-sided lower back pain.  Denies any nausea, vomiting, or diarrhea.  She denies any new activities or anything that would have injured her back.  No recent trauma.  HPI  Past Medical History:  Diagnosis Date  . MR (mental retardation)   . Schizo-affective psychosis (HCC)     There are no active problems to display for this patient.   Past Surgical History:  Procedure Laterality Date  . CHOLECYSTECTOMY       OB History   None      Home Medications    Prior to Admission medications   Medication Sig Start Date End Date Taking? Authorizing Provider  albuterol (PROVENTIL HFA;VENTOLIN HFA) 108 (90 Base) MCG/ACT inhaler Inhale 1-2 puffs into the lungs every 6 (six) hours as needed for wheezing or shortness of breath. 02/06/17   Demetrios Loll T, PA-C  ARIPiprazole (ABILIFY PO) Take by mouth.    [provider]  benzonatate (TESSALON) 100 MG capsule Take 1 capsule (100 mg total) by mouth every 8 (eight) hours. 02/06/17   Rise Mu, PA-C  cephALEXin (KEFLEX) 500 MG capsule Take 1 capsule (500 mg total) by mouth 2 (two) times daily for 10 days. 10/08/17 10/18/17  Cristina Gong, PA-C  ferrous sulfate 325 (65 FE) MG tablet Take 325 mg by mouth daily with breakfast.    [provider]    FLUoxetine (PROZAC) 20 MG capsule Take 20 mg by mouth daily.    [provider]  HYDROcodone-acetaminophen (NORCO) 5-325 MG per tablet Take 1-2 tablets by mouth every 6 (six) hours as needed (for pain). 08/15/14   Molpus, Jonny Ruiz, MD  ondansetron (ZOFRAN ODT) 8 MG disintegrating tablet Take 1 tablet (8 mg total) by mouth every 8 (eight) hours as needed for nausea or vomiting. 08/15/14   Molpus, Jonny Ruiz, MD  predniSONE (DELTASONE) 20 MG tablet Take 2 tablets (40 mg total) by mouth daily with breakfast. 02/06/17   Rise Mu, PA-C  Vitamin D, Ergocalciferol, (DRISDOL) 50000 UNITS CAPS capsule Take 50,000 Units by mouth every 7 (seven) days.    [provider]    Family History No family history on file.  Social History Social History   Tobacco Use  . Smoking status: Never Smoker  . Smokeless tobacco: Never Used  Substance Use Topics  . Alcohol use: No  . Drug use: No     Allergies   Patient has no known allergies.   Review of Systems Review of Systems  Constitutional: Negative for chills and fever.  HENT: Positive for ear pain.   Genitourinary: Positive for dysuria and frequency.  Musculoskeletal: Positive for back pain.  All other systems reviewed and are negative.    Physical Exam Updated Vital Signs BP 122/65 (BP Location: Left Arm)  Pulse 86   Temp 98.8 F (37.1 C) (Oral)   Resp (!) 24   Ht 5\' 7"  (1.702 m)   Wt 127 kg   LMP 09/24/2017 (Approximate)   SpO2 99%   BMI 43.85 kg/m   Physical Exam  Constitutional: She appears well-developed. No distress.  HENT:  Head: Normocephalic and atraumatic.  Right Ear: External ear normal.  Left Ear: External ear normal.  Moving insect present in left ear canal.  No bleeding from the ear.  After removed inspection shows intact TM.  Mild erythema in canal with out drainage or laceration.   Abdominal: Soft. Bowel sounds are normal. She exhibits no distension. There is no tenderness. There is no guarding.   NO CVA TTPercussion.   Musculoskeletal:  Right lower back lower lumbar paraspinal muscle TTP, which recreates and exacerbates her pain.   No Midline TTP.  No step offs or deformities.    Neurological: She is alert.  Skin: Skin is warm and dry. She is not diaphoretic.  Nursing note and vitals reviewed.    ED Treatments / Results  Labs (all labs ordered are listed, but only abnormal results are displayed) Labs Reviewed  URINALYSIS, ROUTINE W REFLEX MICROSCOPIC - Abnormal; Notable for the following components:      Result Value   APPearance HAZY (*)    Specific Gravity, Urine >1.030 (*)    Hgb urine dipstick TRACE (*)    All other components within normal limits  URINALYSIS, MICROSCOPIC (REFLEX) - Abnormal; Notable for the following components:   Bacteria, UA MANY (*)    All other components within normal limits  PREGNANCY, URINE    EKG None  Radiology No results found.  Procedures .Foreign Body Removal Date/Time: 10/09/2017 3:53 PM Performed by: Cristina Gong, PA-C Authorized by: Cristina Gong, PA-C  Consent: Verbal consent obtained. Consent given by: patient and parent Body area: ear Location details: left ear  Anesthesia: Local Anesthetic: lidocaine 1% without epinephrine (Was dripped into ear to kill insect) Localization method: visualized and ENT speculum Removal mechanism: alligator forceps Complexity: simple 1 objects recovered. Objects recovered: Insect (Cockroach) Post-procedure assessment: foreign body removed Patient tolerance: Patient tolerated the procedure well with no immediate complications   (including critical care time)  Medications Ordered in ED Medications  lidocaine (PF) (XYLOCAINE) 1 % injection 5 mL (has no administration in time range)  cephALEXin (KEFLEX) capsule 500 mg (has no administration in time range)  ondansetron (ZOFRAN-ODT) disintegrating tablet 4 mg (4 mg Oral Given 10/08/17 2143)     Initial Impression /  Assessment and Plan / ED Course  I have reviewed the triage vital signs and the nursing notes.  Pertinent labs & imaging results that were available during my care of the patient were reviewed by me and considered in my medical decision making (see chart for details).    Patient presents today for evaluation of multiple complaints.  Back pain: She has lower lumbar tenderness to palpation along paraspinal muscles without midline tenderness to palpation, step-offs, or deformities.  Palpation over paraspinal muscles re-creates and exacerbates her pain, she denies any specific injuries.  Recommended conservative care.  Urinary tract infection: Patient has increased urgency and frequency.  Urine with trace blood, many bacteria, 6-10 red and white cells.  There is also 6-10 squamous epithelial cells, however given her symptoms with many bacteria will treat with Keflex.  Discussed basic hygiene measures including making sure she is wiping front to back with patient.  Her back pain  is very lower lumbar, re-created with palpation, not consistent with CVA tenderness of pyelonephritis.  She is also afebrile, not tachycardic and generally well-appearing.  Suspect that these are 2 separate processes.  Ear foreign body: Living insect, possibly a small cockroach about 1.5 to 2 cm, was removed from patient's left ear canal.  After examination showed generalized erythema without drainage, no obvious TM perforation.  Recommended PCP follow-up in 2 days for repeat examination.  As she is not obviously infected at this time, suspect she is more irritated from having a live insect in her ear.  Final Clinical Impressions(s) / ED Diagnoses   Final diagnoses:  Ear foreign body, left, initial encounter  Acute cystitis with hematuria    ED Discharge Orders         Ordered    cephALEXin (KEFLEX) 500 MG capsule  2 times daily     10/08/17 2222           Cristina Gong, [place] 10/09/17 1558    Charlynne Pander, MD 10/09/17 [phone]

## 2017-10-08 NOTE — ED Notes (Signed)
Pt was vomiting when entered the room. States this started since arriving to the ED. Pt medicated per order. Primary nurse aware.

## 2018-07-26 ENCOUNTER — Encounter: Payer: Self-pay | Admitting: *Deleted

## 2018-07-26 ENCOUNTER — Telehealth: Payer: Self-pay | Admitting: Obstetrics and Gynecology

## 2018-07-26 ENCOUNTER — Telehealth: Payer: Self-pay | Admitting: Medical

## 2018-07-26 NOTE — Telephone Encounter (Signed)
The patient called in regard to the upcoming appointment to confirm. Informed the patient of wearing a face mask, sanitizing hands upon entering the office, and no visitors or children are allowed in the office due to the [place] restrictions. Left a detailed voicemail message informing of the date, time, and new location of the office. If she has any questions or concerns please contact our office.    In regards to previous notes the office faxed the reason for the visit separate from the fax.

## 2018-07-26 NOTE — Telephone Encounter (Signed)
[place] to confirm the reason for the appointment as the records were sent to our office. I interacted with Margarita Grizzle from medical records and release of information. She stated there was no release on file. She also stated the individual that sent the documents (April Nichols) is not available to be reached as she does not have a direct line. She also stated that in some cases the records are sent to the office by an automated system. We determined the records can be placed in the shred due to no cause of referral.

## 2018-07-27 NOTE — Telephone Encounter (Signed)
Opened in error

## 2018-08-18 ENCOUNTER — Encounter: Payer: Medicaid Other | Admitting: Obstetrics & Gynecology

## 2018-09-07 ENCOUNTER — Encounter: Payer: Medicaid Other | Admitting: Obstetrics and Gynecology

## 2018-09-27 ENCOUNTER — Encounter: Payer: Medicaid Other | Admitting: Family Medicine

## 2018-10-16 ENCOUNTER — Encounter: Payer: Self-pay | Admitting: Obstetrics & Gynecology

## 2018-10-16 ENCOUNTER — Encounter: Payer: Medicaid Other | Admitting: Obstetrics & Gynecology

## 2018-11-13 ENCOUNTER — Encounter: Payer: Self-pay | Admitting: Obstetrics and Gynecology

## 2018-11-13 ENCOUNTER — Other Ambulatory Visit: Payer: Self-pay

## 2018-11-13 ENCOUNTER — Ambulatory Visit (INDEPENDENT_AMBULATORY_CARE_PROVIDER_SITE_OTHER): Payer: Medicaid Other | Admitting: Obstetrics and Gynecology

## 2018-11-13 DIAGNOSIS — N946 Dysmenorrhea, unspecified: Secondary | ICD-10-CM | POA: Insufficient documentation

## 2018-11-13 DIAGNOSIS — N920 Excessive and frequent menstruation with regular cycle: Secondary | ICD-10-CM | POA: Diagnosis present

## 2018-11-13 DIAGNOSIS — Z3202 Encounter for pregnancy test, result negative: Secondary | ICD-10-CM

## 2018-11-13 DIAGNOSIS — Z3009 Encounter for other general counseling and advice on contraception: Secondary | ICD-10-CM

## 2018-11-13 LAB — POCT URINE PREGNANCY: Preg Test, Ur: NEGATIVE

## 2018-11-13 MED ORDER — MISOPROSTOL 200 MCG PO TABS: ORAL_TABLET | ORAL | 0 refills | Status: DC

## 2018-11-13 NOTE — Addendum Note (Signed)
Addended by: Aletha Halim on: 11/13/2018 04:46 PM   Modules accepted: Orders

## 2018-11-13 NOTE — Progress Notes (Addendum)
Obstetrics and Gynecology New Patient Evaluation  Appointment Date: 11/13/2018  OBGYN Clinic: Center for [facility]  Primary Care Provider: [facility])  Referring Provider: Garlan Fillers, MD  Chief Complaint:  Chief Complaint  Patient presents with  . Menstrual Problem    History of Present Illness: Melissa Griffin is a 28 y.o. African-American G0 (Patient's last menstrual period was 10/31/2018.), seen for the above chief complaint. Her past medical history is significant for morbid obesity.   Period just ended. No current bleeding or pain  Review of Systems: as noted in the History of Present Illness.  Past Medical History:  Past Medical History:  Diagnosis Date  . MR (mental retardation)   . Schizo-affective psychosis ([place])     Past Surgical History:  Past Surgical History:  Procedure Laterality Date  . CHOLECYSTECTOMY      Past Obstetrical History:  OB History  Gravida Para Term Preterm AB Living  0 0 0 0 0 0  SAB TAB Ectopic Multiple Live Births  0 0 0 0 0    Past Gynecological History: As per HPI. Menarche age 44 Periods: qmonth, regular, 10 days, no intermenstrual bleeding, heavy and painful. Has never tried anything to help with periods or birth control  History of Pap Smear(s): Yes.   Last pap 2011, which was negative Patient has never been sexually active  Social History:  Social History   Socioeconomic History  . Marital status: Single    Spouse name: Not on file  . Number of children: 0  . Years of education: Not on file  . Highest education level: Not on file  Occupational History  . Not on file  Social Needs  . Financial resource strain: Not hard at all  . Food insecurity    Worry: Never true    Inability: Never true  . Transportation needs    Medical: No    Non-medical: No  Tobacco Use  . Smoking status: Never Smoker  . Smokeless tobacco: Never Used  Substance and Sexual Activity  . Alcohol use: No  .  Drug use: No  . Sexual activity: Yes    Birth control/protection: None  Lifestyle  . Physical activity    Days per week: 3 days    Minutes per session: 30 min  . Stress: Only a little  Relationships  . Social Herbalist on phone: Not on file    Gets together: Not on file    Attends religious service: Not on file    Active member of club or organization: Not on file    Attends meetings of clubs or organizations: Not on file    Relationship status: Not on file  . Intimate partner violence    Fear of current or ex partner: No    Emotionally abused: No    Physically abused: No    Forced sexual activity: No  Other Topics Concern  . Not on file  Social History Narrative   Patient is followed by [place] [facility].       Patient lives in something similar to a group home type setting.     Family History: History reviewed. No pertinent family history.  Medications Melissa Griffin had no medications administered during this visit. Current Outpatient Medications  Medication Sig Dispense Refill  . ARIPiprazole (ABILIFY PO) Take by mouth.    . ferrous sulfate 325 (65 FE) MG tablet Take 325 mg by mouth daily with breakfast.    .  FLUoxetine (PROZAC) 20 MG capsule Take 20 mg by mouth daily.    . Vitamin D, Ergocalciferol, (DRISDOL) 50000 UNITS CAPS capsule Take 50,000 Units by mouth every 7 (seven) days.     No current facility-administered medications for this visit.     Allergies Patient has no known allergies.   Physical Exam:  BP 117/71   Pulse 75   Temp 98.1 F (36.7 C)   Wt (!) 315 lb (142.9 kg)   LMP 10/31/2018   BMI 49.34 kg/m  Body mass index is 49.34 kg/m. General appearance: Well nourished, well developed female in no acute distress.  Respiratory:  Normal respiratory effort Neuro/Psych:  Normal mood and affect.  Skin:  Warm and dry.   Laboratory: as above. UPT negative  Radiology: none  Assessment: pt  stable  Plan:  1. Morbid obesity (HCC) See below. Watching diet and exercise encouraged.   2. Menorrhagia with regular cycle Patient desiring an option to help her lose weight. D/w her that no option will help her lose weight. Options d/w her and she'd like to LNG-IUD. Will get u/s and have patient come back and do pap and IUD Mirenainsertion at that time. D/w her small chance of uterus being too small. Pt also amenable to doing cytotec PO night before her appt to help with IUD insertion.   Patient denies any h/o migraines, aura, blood clots.   3. Dysmenorrhea See above  RTC 1 month for Mirena and pap  Cornelia Copa MD Attending Center for [facility])

## 2018-11-28 ENCOUNTER — Ambulatory Visit (HOSPITAL_COMMUNITY): Payer: Medicaid Other

## 2018-12-07 ENCOUNTER — Ambulatory Visit (HOSPITAL_COMMUNITY)
Admission: RE | Admit: 2018-12-07 | Discharge: 2018-12-07 | Disposition: A | Discharge status: Home / Self Care | Payer: Medicaid Other | Source: Ambulatory Visit | Attending: Obstetrics and Gynecology | Admitting: Obstetrics and Gynecology

## 2018-12-07 ENCOUNTER — Other Ambulatory Visit: Payer: Self-pay

## 2018-12-07 DIAGNOSIS — N920 Excessive and frequent menstruation with regular cycle: Secondary | ICD-10-CM | POA: Diagnosis not present

## 2018-12-07 DIAGNOSIS — N946 Dysmenorrhea, unspecified: Secondary | ICD-10-CM | POA: Diagnosis present

## 2018-12-12 ENCOUNTER — Ambulatory Visit (HOSPITAL_COMMUNITY)
Admission: EM | Admit: 2018-12-12 | Discharge: 2018-12-12 | Disposition: A | Discharge status: Home / Self Care | Payer: Medicaid Other | Attending: Family Medicine | Admitting: Family Medicine

## 2018-12-12 ENCOUNTER — Encounter (HOSPITAL_COMMUNITY): Payer: Self-pay

## 2018-12-12 DIAGNOSIS — R21 Rash and other nonspecific skin eruption: Secondary | ICD-10-CM

## 2018-12-12 MED ORDER — PREDNISONE 20 MG PO TABS: 20.0000 mg | ORAL_TABLET | Freq: Two times a day (BID) | ORAL | 0 refills | Status: DC

## 2018-12-12 MED ORDER — HYDROXYZINE HCL 25 MG PO TABS: 25.0000 mg | ORAL_TABLET | Freq: Three times a day (TID) | ORAL | 0 refills | Status: AC | PRN

## 2018-12-12 NOTE — ED Triage Notes (Addendum)
Pt states having an itchy rash all over her body x 1 week. Pt states having facial swelling x 1 week.

## 2018-12-12 NOTE — ED Notes (Signed)
Medical consultation form from facility filled out by dr Meda Coffee.  Form copied, labeled and placed in patient access for scanning into medical record.

## 2018-12-12 NOTE — ED Provider Notes (Signed)
[place]    CSN: [id number] Arrival date & time: 12/12/18  1606      History   Chief Complaint Chief Complaint  Patient presents with  . Rash  . Facial Swelling    HPI Melissa Griffin is a 28 y.o. female.   HPI  Rash that itches, face chest back and arms.  She has some swelling in her face.  No treatment to date.  Vague history of family has been having a rash as well and having to fumigate their house.  Here with a guardian/caregiver.  No rash in their household.  No shortness of breath.  No prior problems with allergies or rashes.  New medicines.  No new soaps or lotions.  No new foods.  Past Medical History:  Diagnosis Date  . MR (mental retardation)   . Schizo-affective psychosis [facility])     Patient Active Problem List   Diagnosis Date Noted  . Morbid obesity ([place]) 11/13/2018  . Dysmenorrhea 11/13/2018  . Menorrhagia with regular cycle 11/13/2018    Past Surgical History:  Procedure Laterality Date  . CHOLECYSTECTOMY      OB History    Gravida  0   Para  0   Term  0   Preterm  0   AB  0   Living  0     SAB  0   TAB  0   Ectopic  0   Multiple  0   Live Births  0            Home Medications    Prior to Admission medications   Medication Sig Start Date End Date Taking? Authorizing Provider  cloNIDine (CATAPRES) 0.2 MG tablet Take 0.2 mg by mouth 2 (two) times daily.   Yes [provider]  fluPHENAZine (PROLIXIN) 5 MG tablet Take 5 mg by mouth daily.   Yes [provider]  FLUVOXAMINE MALEATE PO Take 50 mg by mouth.   Yes [provider]  oxcarbazepine (TRILEPTAL) 600 MG tablet Take 600 mg by mouth 2 (two) times daily.   Yes [provider]  traZODone (DESYREL) 150 MG tablet Take by mouth at bedtime.   Yes [provider]  ARIPiprazole (ABILIFY PO) Take by mouth.    [provider]  FLUoxetine (PROZAC) 20 MG capsule Take 20 mg by mouth daily.    [provider]   hydrOXYzine (ATARAX/VISTARIL) 25 MG tablet Take 1 tablet (25 mg total) by mouth 3 (three) times daily as needed. 12/12/18   Raylene Everts, MD  predniSONE (DELTASONE) 20 MG tablet Take 1 tablet (20 mg total) by mouth 2 (two) times daily with a meal. 12/12/18   Raylene Everts, MD  Vitamin D, Ergocalciferol, (DRISDOL) 50000 UNITS CAPS capsule Take 50,000 Units by mouth every 7 (seven) days.    [provider]  ferrous sulfate 325 (65 FE) MG tablet Take 325 mg by mouth daily with breakfast.  12/12/18  [provider]  misoprostol (CYTOTEC) 200 MCG tablet Place two tabs (430mcg) in cheek for 30 minutes and then swallow the night before your appointment 11/13/18 12/12/18  Aletha Halim, MD    Family History History reviewed. No pertinent family history.  Social History Social History   Tobacco Use  . Smoking status: Never Smoker  . Smokeless tobacco: Never Used  Substance Use Topics  . Alcohol use: No  . Drug use: No     Allergies   Patient has no known allergies.  Review of Systems Review of Systems  Constitutional: Negative for chills and fever.  HENT: Negative for ear pain and sore throat.   Eyes: Negative for pain and visual disturbance.  Respiratory: Negative for cough and shortness of breath.   Cardiovascular: Negative for chest pain and palpitations.  Gastrointestinal: Negative for abdominal pain and vomiting.  Genitourinary: Negative for dysuria and hematuria.  Musculoskeletal: Negative for arthralgias and back pain.  Skin: Positive for rash. Negative for color change.  Neurological: Negative for seizures and syncope.  All other systems reviewed and are negative.    Physical Exam Triage Vital Signs ED Triage Vitals  Enc Vitals Group     BP 12/12/18 1636 111/67     Pulse Rate 12/12/18 1636 72     Resp 12/12/18 1636 17     Temp 12/12/18 1636 98.7 F (37.1 C)     Temp Source 12/12/18 1636 Oral     SpO2 12/12/18 1636 97 %     Weight  --      Height --      Head Circumference --      Peak Flow --      Pain Score 12/12/18 1628 0     Pain Loc --      Pain Edu? --      Excl. in GC? --    No data found.  Updated Vital Signs BP 111/67 (BP Location: Right Arm)   Pulse 72   Temp 98.7 F (37.1 C) (Oral)   Resp 17   LMP  (Approximate) Comment: 3 weeks ago aprox per caregiver  SpO2 97%   Visual Acuity Right Eye Distance:   Left Eye Distance:   Bilateral Distance:    Right Eye Near:   Left Eye Near:    Bilateral Near:     Physical Exam Vitals signs reviewed.  Constitutional:      General: She is not in acute distress.    Appearance: She is well-developed. She is obese.  HENT:     Head: Normocephalic and atraumatic.  Eyes:     Conjunctiva/sclera: Conjunctivae normal.     Pupils: Pupils are equal, round, and reactive to light.  Neck:     Musculoskeletal: Normal range of motion.  Cardiovascular:     Rate and Rhythm: Normal rate and regular rhythm.     Heart sounds: Normal heart sounds.  Pulmonary:     Effort: Pulmonary effort is normal. No respiratory distress.     Breath sounds: Normal breath sounds.  Abdominal:     General: There is no distension.     Palpations: Abdomen is soft.  Musculoskeletal: Normal range of motion.  Skin:    General: Skin is warm and dry.     Comments: Patient has a faint pinpoint papular rash across her forehead.  Similar rash on arms.  Across chest there are several oval lesions with clearing centers, possible pityriasis.  No sign of infectious disease/scabies.  Neurological:     Mental Status: She is alert.  Psychiatric:        Mood and Affect: Mood normal.        Behavior: Behavior normal.      UC Treatments / Results  Labs (all labs ordered are listed, but only abnormal results are displayed) Labs Reviewed - No data to display  EKG   Radiology No results found.  Procedures Procedures (including critical care time)  Medications Ordered in UC Medications -  No data to display  Initial Impression /  Assessment and Plan / UC Course  I have reviewed the triage vital signs and the nursing notes.  Pertinent labs & imaging results that were available during my care of the patient were reviewed by me and considered in my medical decision making (see chart for details).     Pityriasis versus eczematous rash.  With facial swelling would favor possible allergic rash.  Will treat with prednisone and Atarax.  Return as needed. Final Clinical Impressions(s) / UC Diagnoses   Final diagnoses:  Rash and nonspecific skin eruption     Discharge Instructions     Take the prednisone 2 x a day for 5 days Take atarax as needed itching Call primary care if not improving in a week    ED Prescriptions    Medication Sig Dispense Auth. Provider   hydrOXYzine (ATARAX/VISTARIL) 25 MG tablet Take 1 tablet (25 mg total) by mouth 3 (three) times daily as needed. 30 tablet Eustace MooreNelson, Sharen Youngren Sue, MD   predniSONE (DELTASONE) 20 MG tablet Take 1 tablet (20 mg total) by mouth 2 (two) times daily with a meal. 10 tablet Eustace MooreNelson, Maryann Mccall Sue, MD     PDMP not reviewed this encounter.   Eustace MooreNelson, Attilio Zeitler Sue, MD 12/12/18 [phone]

## 2018-12-12 NOTE — Discharge Instructions (Signed)
Take the prednisone 2 x a day for 5 days Take atarax as needed itching Call primary care if not improving in a week

## 2018-12-13 ENCOUNTER — Telehealth: Payer: Self-pay | Admitting: *Deleted

## 2018-12-13 ENCOUNTER — Telehealth: Payer: Self-pay | Admitting: Obstetrics and Gynecology

## 2018-12-13 NOTE — Telephone Encounter (Signed)
GYN Telephone Note Phone numbers called that are in the system re: ultrasound results. Will d/w pt and caretaker more at visit on 11/20.  Durene Romans MD Attending Center for [employer] (Faculty Practice) 12/13/2018 Time: [phone]

## 2018-12-13 NOTE — Telephone Encounter (Signed)
I called Melissa Griffin and left a message I am calling with information from your provider. Please call our office tomorrow during office hours.  [name],RN

## 2018-12-13 NOTE — Telephone Encounter (Signed)
-----   Message from Aletha Halim, MD sent at 12/13/2018  1:51 PM EST ----- Can y'all try and contact the patient, too, and let her know that she does NOT need to take the cytotec before her next visit with me and to change her visit from an IUD insertion visit to just a regular u/s f/u visit. thanks

## 2018-12-15 ENCOUNTER — Encounter (HOSPITAL_COMMUNITY): Payer: Self-pay | Admitting: Emergency Medicine

## 2018-12-15 ENCOUNTER — Other Ambulatory Visit: Payer: Self-pay

## 2018-12-15 ENCOUNTER — Ambulatory Visit (HOSPITAL_COMMUNITY)
Admission: EM | Admit: 2018-12-15 | Discharge: 2018-12-15 | Disposition: A | Discharge status: Home / Self Care | Payer: Medicaid Other | Attending: Urgent Care | Admitting: Urgent Care

## 2018-12-15 DIAGNOSIS — L299 Pruritus, unspecified: Secondary | ICD-10-CM

## 2018-12-15 DIAGNOSIS — R21 Rash and other nonspecific skin eruption: Secondary | ICD-10-CM | POA: Diagnosis not present

## 2018-12-15 MED ORDER — IVERMECTIN 3 MG PO TABS: ORAL_TABLET | ORAL | 0 refills | Status: DC

## 2018-12-15 NOTE — ED Provider Notes (Signed)
[facility]   MRN: [id number] DOB: Sep 17, 1990  Subjective:   Melissa Griffin is a 28 y.o. female presenting for ongoing pruritic rash over her chest, arms, upper back and face. Patient was seen last for this several days ago and was started on a prednisone course. Patient and her family member report that it is still persisting. She has been using hydroxyzine even before she started to have the rash. There are multiple possible exposures as her rash started after visiting her parents, state that they had some type of problem that needed to be fumigated at their home. They do not know about bedbugs or scabies with her parents. Patient is also using a scented perfume daily. She uses multiple scented products. No new medications recently. No fever, chest tightness, shortness of breath, facial swelling.  No current facility-administered medications for this encounter.   Current Outpatient Medications:  .  hydrOXYzine (ATARAX/VISTARIL) 25 MG tablet, Take 1 tablet (25 mg total) by mouth 3 (three) times daily as needed., Disp: 30 tablet, Rfl: 0 .  predniSONE (DELTASONE) 20 MG tablet, Take 1 tablet (20 mg total) by mouth 2 (two) times daily with a meal., Disp: 10 tablet, Rfl: 0 .  ARIPiprazole (ABILIFY PO), Take by mouth., Disp: , Rfl:  .  cloNIDine (CATAPRES) 0.2 MG tablet, Take 0.2 mg by mouth 2 (two) times daily., Disp: , Rfl:  .  FLUoxetine (PROZAC) 20 MG capsule, Take 20 mg by mouth daily., Disp: , Rfl:  .  fluPHENAZine (PROLIXIN) 5 MG tablet, Take 5 mg by mouth daily., Disp: , Rfl:  .  FLUVOXAMINE MALEATE PO, Take 50 mg by mouth., Disp: , Rfl:  .  oxcarbazepine (TRILEPTAL) 600 MG tablet, Take 600 mg by mouth 2 (two) times daily., Disp: , Rfl:  .  traZODone (DESYREL) 150 MG tablet, Take by mouth at bedtime., Disp: , Rfl:  .  Vitamin D, Ergocalciferol, (DRISDOL) 50000 UNITS CAPS capsule, Take 50,000 Units by mouth every 7 (seven) days., Disp: , Rfl:    No Known Allergies  Past Medical  History:  Diagnosis Date  . MR (mental retardation)   . Schizo-affective psychosis (HCC)      Past Surgical History:  Procedure Laterality Date  . CHOLECYSTECTOMY      No family history on file.  Social History   Tobacco Use  . Smoking status: Never Smoker  . Smokeless tobacco: Never Used  Substance Use Topics  . Alcohol use: No  . Drug use: No    ROS   Objective:   Vitals: BP 119/65   Pulse 70   Temp (!) 97.5 F (36.4 C) (Temporal)   Resp 16   LMP 11/24/2018   SpO2 97%   Physical Exam Constitutional:      General: She is not in acute distress.    Appearance: Normal appearance. She is well-developed. She is not ill-appearing.  HENT:     Head: Normocephalic and atraumatic.     Nose: Nose normal.     Mouth/Throat:     Mouth: Mucous membranes are moist.     Pharynx: Oropharynx is clear.  Eyes:     General: No scleral icterus.    Extraocular Movements: Extraocular movements intact.     Pupils: Pupils are equal, round, and reactive to light.  Cardiovascular:     Rate and Rhythm: Normal rate.  Pulmonary:     Effort: Pulmonary effort is normal. No respiratory distress.     Breath sounds: Normal breath sounds. No stridor.  No wheezing, rhonchi or rales.  Skin:    General: Skin is warm and dry.     Comments: Patient has multiple patches of varying skin lesions. There is a patch over her right upper chest that is nodular and erythematous. She has patches of annular lesions that are dry and scaly, hyperpigmented over her upper arms and bilateral lateral neck. She also has multiple nodular lesions across her forehead extending across the right side of her face down to her jawline. On close examination, it appears that she has a glittery product diffusely scattered over her upper chest, arms and face.  Neurological:     General: No focal deficit present.     Mental Status: She is alert and oriented to person, place, and time.  Psychiatric:        Mood and Affect: Mood  normal.        Behavior: Behavior normal.        Thought Content: Thought content normal.        Judgment: Judgment normal.      Assessment and Plan :   1. Rash and nonspecific skin eruption   2. Itching     Rash with multiple possible etiologies. I suspect however that she is having a contact dermatitis due to the products that she is using. Recommended careful skin care. We'll hold off on using another steroid course, continue hydroxyzine. Stop using any fragrant products. Will cover for scabies given that her rash started after she visited her parents and she does have some nodular lesions that are possible burrows. Counseled patient on potential for adverse effects with medications prescribed/recommended today, ER and return-to-clinic precautions discussed, patient verbalized understanding.    Jaynee Eagles, PA-C 12/15/18 1428

## 2018-12-15 NOTE — Discharge Instructions (Addendum)
Stop using body creams, perfumes, fragrant products. Use hygiene products for gentle skin. For bathing use non-scented regular bar soap (Dove or Dial) for gentle skin.

## 2018-12-15 NOTE — ED Triage Notes (Signed)
PT has an itchy rash on chest, neck, arms for over a week. Was seen 11/10 for same. No improvement

## 2018-12-18 NOTE — Telephone Encounter (Signed)
I contacted Melissa Griffin and informed her and her caretaker that she does not need to take the medication previously prescribed (cytotec) by Dr. Ilda Basset prior to her visit on 11/20. She and the caretaker voiced understanding.

## 2018-12-22 ENCOUNTER — Encounter: Payer: Self-pay | Admitting: Obstetrics and Gynecology

## 2018-12-22 ENCOUNTER — Other Ambulatory Visit: Payer: Self-pay

## 2018-12-22 ENCOUNTER — Ambulatory Visit (INDEPENDENT_AMBULATORY_CARE_PROVIDER_SITE_OTHER): Payer: Medicaid Other | Admitting: Obstetrics and Gynecology

## 2018-12-22 ENCOUNTER — Encounter: Payer: Self-pay | Admitting: Family Medicine

## 2018-12-22 VITALS — BP 112/73 | HR 79 | Ht 68.0 in | Wt 318.0 lb

## 2018-12-22 DIAGNOSIS — N840 Polyp of corpus uteri: Secondary | ICD-10-CM

## 2018-12-22 NOTE — Progress Notes (Addendum)
Obstetrics and Gynecology Visit Return Patient Evaluation  Appointment Date: 12/22/2018  Primary Care Provider: [place] [place] Clinic: Center for [facility]  Chief Complaint: follow up ultrasound  History of Present Illness:  Melissa Griffin is a 28 y.o. here with above CC. Patient seen by me on 10/12 for dysmenorrhea and menorrhagia and set up for outpatient ultrasound and tentative plan for mirena iud and pap smear today.  Ultrasound showed an endometrial polyp. I was unable to get in contact with patient to go over results in the interim.  Today, she states she's doing well and not having any pain or bleeding.   Patient is here with her caretaker today.    Review of Systems: as noted in the History of Present Illness.  Patient Active Problem List   Diagnosis Date Noted  . Morbid obesity ([place]) 11/13/2018  . Dysmenorrhea 11/13/2018  . Menorrhagia with regular cycle 11/13/2018   Medications:  Lalisa Kiehn had no medications administered during this visit. Current Outpatient Medications  Medication Sig Dispense Refill  . ARIPiprazole (ABILIFY PO) Take by mouth.    . cloNIDine (CATAPRES) 0.2 MG tablet Take 0.2 mg by mouth 2 (two) times daily.    Marland Kitchen FLUoxetine (PROZAC) 20 MG capsule Take 20 mg by mouth daily.    . fluPHENAZine (PROLIXIN) 5 MG tablet Take 5 mg by mouth daily.    Marland Kitchen FLUVOXAMINE MALEATE PO Take 50 mg by mouth.    . hydrOXYzine (ATARAX/VISTARIL) 25 MG tablet Take 1 tablet (25 mg total) by mouth 3 (three) times daily as needed. 30 tablet 0  . ivermectin (STROMECTOL) 3 MG TABS tablet Take 8 tablets today. In 2 weeks, take 8 tablets again. 16 tablet 0  . oxcarbazepine (TRILEPTAL) 600 MG tablet Take 600 mg by mouth 2 (two) times daily.    . traZODone (DESYREL) 150 MG tablet Take by mouth at bedtime.    . Vitamin D, Ergocalciferol, (DRISDOL) 50000 UNITS CAPS capsule Take 50,000 Units by mouth every 7 (seven) days.     No current  facility-administered medications for this visit.     Radiology:  CLINICAL DATA:  Dysmenorrhea, menorrhagia with regular cycle  EXAM: ULTRASOUND PELVIS TRANSVAGINAL  TECHNIQUE: Transvaginal ultrasound examination of the pelvis was performed including evaluation of the uterus, ovaries, adnexal regions, and pelvic cul-de-sac.  COMPARISON:  None  FINDINGS: Uterus  Measurements: 8.0 x 4.0 x 4.9 cm = volume: 81 mL. Anteverted. Normal morphology without mass  Endometrium  Thickness: 14 mm. Focal hyperechoic nodule identified at the upper uterine segment endometrial canal measuring 19 x 8 x 8 mm. Lesion demonstrates entering blood vessels on color Doppler imaging. Finding is highly suspicious for an endometrial polyp.  Right ovary  Measurements: 3.9 x 3.0 x 2.4 cm = volume: 14.7 mL. Dominant follicle without mass  Left ovary  Measurements: 3.8 x 1.6 x 2.4 cm = volume: 8.0 mL. Normal morphology without mass  Other findings:  No free pelvic fluid.  No adnexal masses.  IMPRESSION: Probable endometrial polyp 19 x 8 x 8 mm.  Consider further evaluation with sonohysterogram for confirmation prior to hysteroscopy. Endometrial sampling should also be considered if patient is at high risk for endometrial carcinoma. (Ref: Radiological Reasoning: Algorithmic Workup of Abnormal Vaginal Bleeding with Endovaginal Sonography and Sonohysterography. AJR 2008; 419:Q22-29)   Electronically Signed   By: Lavonia Dana M.D.   On: 12/07/2018 13:32  Allergies: has No Known Allergies.  Physical Exam:  BP 112/73   Pulse 79  Ht 5\' 8"  (1.727 m)   Wt (!) 318 lb (144.2 kg)   LMP 12/12/2018 (Within Days)   BMI 48.35 kg/m  Body mass index is 48.35 kg/m. General appearance: Well nourished, well developed female in no acute distress.  Neuro/Psych:  Normal mood and affect.    Assessment: pt doing well  Plan: D/w pt and caretaker re: endometrial polyp and I recommend  surgery as this is the most likely etiology for her heavy and painful periods and medications won't work to correct this.  Since she is having regular periods, I don't think she needs to have the Mirena. If the heavy and painful periods persist after polyp removal then we can place it as an outpatient at a later time. They are amenable with proceeding with surgery.   Will post her for outpatient surgery: hysteroscopy, dilation and curettage, endometrial myosure polypectomy and pap smear  Contact info updated for caregiver in epic.   RTC: post op  13/11/2018 Griffin Attending Center for [facility] C[facility])

## 2019-01-12 ENCOUNTER — Other Ambulatory Visit: Payer: Self-pay | Admitting: Obstetrics and Gynecology

## 2019-01-12 ENCOUNTER — Other Ambulatory Visit: Payer: Self-pay

## 2019-01-12 ENCOUNTER — Encounter (HOSPITAL_BASED_OUTPATIENT_CLINIC_OR_DEPARTMENT_OTHER): Payer: Self-pay | Admitting: Obstetrics and Gynecology

## 2019-01-12 NOTE — Progress Notes (Signed)
Reviewed pt's pmh with Dr Jillyn Hidden.Marland Kitchen BMI is 48, no need for anesthesia consult prior to surgery.  Pre op call done with pt's caregiver Melissa Griffin and all pre op instructions given to her for dos and covid testing.  LVM for pt's mother/ legal guardian Melissa Griffin to arrange for her to sign surgical consent form either on the dos if she is planning to come, or ahead of time.  Will need orders from Dr Ilda Basset (message sent by K. Felkel RN) so that consent can be filled out for signature.

## 2019-01-13 ENCOUNTER — Other Ambulatory Visit (HOSPITAL_COMMUNITY)
Admission: RE | Admit: 2019-01-13 | Discharge: 2019-01-13 | Disposition: A | Discharge status: Home / Self Care | Payer: Medicaid Other | Source: Ambulatory Visit | Attending: Obstetrics and Gynecology | Admitting: Obstetrics and Gynecology

## 2019-01-13 DIAGNOSIS — Z20828 Contact with and (suspected) exposure to other viral communicable diseases: Secondary | ICD-10-CM | POA: Insufficient documentation

## 2019-01-13 DIAGNOSIS — Z01812 Encounter for preprocedural laboratory examination: Secondary | ICD-10-CM | POA: Insufficient documentation

## 2019-01-14 LAB — NOVEL CORONAVIRUS, NAA (HOSP ORDER, SEND-OUT TO REF LAB; TAT 18-24 HRS): SARS-CoV-2, NAA: NOT DETECTED

## 2019-01-17 ENCOUNTER — Encounter (HOSPITAL_BASED_OUTPATIENT_CLINIC_OR_DEPARTMENT_OTHER)
Admission: RE | Disposition: A | Discharge status: Home / Self Care | Payer: Self-pay | Source: Home / Self Care | Attending: Obstetrics and Gynecology

## 2019-01-17 ENCOUNTER — Encounter (HOSPITAL_BASED_OUTPATIENT_CLINIC_OR_DEPARTMENT_OTHER): Payer: Self-pay | Admitting: Obstetrics and Gynecology

## 2019-01-17 ENCOUNTER — Ambulatory Visit (HOSPITAL_BASED_OUTPATIENT_CLINIC_OR_DEPARTMENT_OTHER): Payer: Medicaid Other | Admitting: Anesthesiology

## 2019-01-17 ENCOUNTER — Ambulatory Visit (HOSPITAL_BASED_OUTPATIENT_CLINIC_OR_DEPARTMENT_OTHER)
Admission: RE | Admit: 2019-01-17 | Discharge: 2019-01-17 | Disposition: A | Discharge status: Home / Self Care | Payer: Medicaid Other | Attending: Obstetrics and Gynecology | Admitting: Obstetrics and Gynecology

## 2019-01-17 ENCOUNTER — Other Ambulatory Visit (HOSPITAL_COMMUNITY)
Admission: RE | Admit: 2019-01-17 | Discharge: 2019-01-17 | Disposition: A | Discharge status: Home / Self Care | Payer: Medicaid Other | Source: Ambulatory Visit | Attending: Obstetrics and Gynecology | Admitting: Obstetrics and Gynecology

## 2019-01-17 ENCOUNTER — Other Ambulatory Visit: Payer: Self-pay

## 2019-01-17 DIAGNOSIS — Z79899 Other long term (current) drug therapy: Secondary | ICD-10-CM | POA: Insufficient documentation

## 2019-01-17 DIAGNOSIS — N920 Excessive and frequent menstruation with regular cycle: Secondary | ICD-10-CM | POA: Diagnosis present

## 2019-01-17 DIAGNOSIS — Z124 Encounter for screening for malignant neoplasm of cervix: Secondary | ICD-10-CM | POA: Diagnosis not present

## 2019-01-17 DIAGNOSIS — Z9889 Other specified postprocedural states: Secondary | ICD-10-CM

## 2019-01-17 DIAGNOSIS — F79 Unspecified intellectual disabilities: Secondary | ICD-10-CM | POA: Diagnosis not present

## 2019-01-17 DIAGNOSIS — F259 Schizoaffective disorder, unspecified: Secondary | ICD-10-CM | POA: Diagnosis not present

## 2019-01-17 DIAGNOSIS — R87610 Atypical squamous cells of undetermined significance on cytologic smear of cervix (ASC-US): Secondary | ICD-10-CM | POA: Insufficient documentation

## 2019-01-17 DIAGNOSIS — N840 Polyp of corpus uteri: Secondary | ICD-10-CM | POA: Diagnosis not present

## 2019-01-17 DIAGNOSIS — Z6841 Body Mass Index (BMI) 40.0 and over, adult: Secondary | ICD-10-CM | POA: Insufficient documentation

## 2019-01-17 HISTORY — PX: DILATATION & CURETTAGE/HYSTEROSCOPY WITH MYOSURE: SHX6511

## 2019-01-17 LAB — POCT PREGNANCY, URINE: Preg Test, Ur: NEGATIVE

## 2019-01-17 SURGERY — DILATATION & CURETTAGE/HYSTEROSCOPY WITH MYOSURE
Anesthesia: General

## 2019-01-17 MED ORDER — PROPOFOL 10 MG/ML IV BOLUS
INTRAVENOUS | Status: DC | PRN
  Administered 2019-01-17: 200 mg via INTRAVENOUS

## 2019-01-17 MED ORDER — LACTATED RINGERS IV SOLN: INTRAVENOUS | Status: DC

## 2019-01-17 MED ORDER — DEXMEDETOMIDINE HCL IN NACL 200 MCG/50ML IV SOLN
INTRAVENOUS | Status: DC | PRN
  Administered 2019-01-17 (×2): 8 ug via INTRAVENOUS

## 2019-01-17 MED ORDER — FENTANYL CITRATE (PF) 100 MCG/2ML IJ SOLN
INTRAMUSCULAR | Status: DC | PRN
  Administered 2019-01-17 (×4): 25 ug via INTRAVENOUS

## 2019-01-17 MED ORDER — LIDOCAINE HCL 1 % IJ SOLN
INTRAMUSCULAR | Status: DC | PRN
  Administered 2019-01-17: 20 mL

## 2019-01-17 MED ORDER — LIDOCAINE 2% (20 MG/ML) 5 ML SYRINGE
INTRAMUSCULAR | Status: AC
  Filled 2019-01-17: qty 5

## 2019-01-17 MED ORDER — LIDOCAINE HCL (PF) 1 % IJ SOLN
INTRAMUSCULAR | Status: AC
  Filled 2019-01-17: qty 30

## 2019-01-17 MED ORDER — SILVER NITRATE-POT NITRATE 75-25 % EX MISC
CUTANEOUS | Status: DC | PRN
  Administered 2019-01-17: 1

## 2019-01-17 MED ORDER — DEXAMETHASONE SODIUM PHOSPHATE 10 MG/ML IJ SOLN
INTRAMUSCULAR | Status: DC | PRN
  Administered 2019-01-17: 10 mg via INTRAVENOUS

## 2019-01-17 MED ORDER — ACETAMINOPHEN 500 MG PO TABS: 1000.0000 mg | ORAL_TABLET | Freq: Three times a day (TID) | ORAL | 0 refills | Status: AC | PRN

## 2019-01-17 MED ORDER — ONDANSETRON HCL 4 MG/2ML IJ SOLN
INTRAMUSCULAR | Status: DC | PRN
  Administered 2019-01-17: 4 mg via INTRAVENOUS

## 2019-01-17 MED ORDER — MIDAZOLAM HCL 2 MG/2ML IJ SOLN
INTRAMUSCULAR | Status: AC
  Filled 2019-01-17: qty 2

## 2019-01-17 MED ORDER — IBUPROFEN 600 MG PO TABS: 600.0000 mg | ORAL_TABLET | Freq: Four times a day (QID) | ORAL | 0 refills | Status: DC | PRN

## 2019-01-17 MED ORDER — MIDAZOLAM HCL 5 MG/5ML IJ SOLN
INTRAMUSCULAR | Status: DC | PRN
  Administered 2019-01-17: 2 mg via INTRAVENOUS

## 2019-01-17 MED ORDER — KETOROLAC TROMETHAMINE 30 MG/ML IJ SOLN
INTRAMUSCULAR | Status: DC | PRN
  Administered 2019-01-17: 30 mg via INTRAVENOUS

## 2019-01-17 MED ORDER — FENTANYL CITRATE (PF) 100 MCG/2ML IJ SOLN
INTRAMUSCULAR | Status: AC
  Filled 2019-01-17: qty 2

## 2019-01-17 MED ORDER — LIDOCAINE 2% (20 MG/ML) 5 ML SYRINGE
INTRAMUSCULAR | Status: DC | PRN
  Administered 2019-01-17: 80 mg via INTRAVENOUS

## 2019-01-17 MED ORDER — ONDANSETRON HCL 4 MG/2ML IJ SOLN
INTRAMUSCULAR | Status: AC
  Filled 2019-01-17: qty 2

## 2019-01-17 SURGICAL SUPPLY — 24 items
CANISTER SUCT 3000ML PPV (MISCELLANEOUS) ×3 IMPLANT
CATH ROBINSON RED A/P 16FR (CATHETERS) ×3 IMPLANT
DEVICE MYOSURE LITE (MISCELLANEOUS) IMPLANT
DEVICE MYOSURE REACH (MISCELLANEOUS) IMPLANT
DILATOR CANAL MILEX (MISCELLANEOUS) IMPLANT
GAUZE 4X4 16PLY RFD (DISPOSABLE) ×3 IMPLANT
GLOVE BIOGEL PI IND STRL 7.0 (GLOVE) ×1 IMPLANT
GLOVE BIOGEL PI IND STRL 7.5 (GLOVE) ×1 IMPLANT
GLOVE BIOGEL PI INDICATOR 7.0 (GLOVE) ×2
GLOVE BIOGEL PI INDICATOR 7.5 (GLOVE) ×2
GLOVE SURG SS PI 7.0 STRL IVOR (GLOVE) ×3 IMPLANT
GOWN STRL REUS W/TWL LRG LVL3 (GOWN DISPOSABLE) ×3 IMPLANT
GOWN STRL REUS W/TWL XL LVL3 (GOWN DISPOSABLE) ×3 IMPLANT
KIT PROCEDURE FLUENT (KITS) ×3 IMPLANT
MYOSURE XL FIBROID (MISCELLANEOUS)
PACK VAGINAL MINOR WOMEN LF (CUSTOM PROCEDURE TRAY) ×3 IMPLANT
PAD OB MATERNITY 4.3X12.25 (PERSONAL CARE ITEMS) ×3 IMPLANT
PAD PREP 24X48 CUFFED NSTRL (MISCELLANEOUS) ×3 IMPLANT
SEAL ROD LENS SCOPE MYOSURE (ABLATOR) ×3 IMPLANT
SLEEVE SCD COMPRESS KNEE MED (MISCELLANEOUS) ×6 IMPLANT
SUT VIC AB 2-0 SH 27 (SUTURE)
SUT VIC AB 2-0 SH 27XBRD (SUTURE) IMPLANT
SYSTEM TISS REMOVAL MYOSURE XL (MISCELLANEOUS) IMPLANT
TOWEL GREEN STERILE FF (TOWEL DISPOSABLE) ×6 IMPLANT

## 2019-01-17 NOTE — Op Note (Addendum)
Operative Note   01/17/2019  PRE-OP DIAGNOSIS: Menorrhagia. Dysmenorrhea. Endometrial polyp   POST-OP DIAGNOSIS: Same   SURGEON: Surgeon(s) and Role:    Aletha Halim, MD - Primary  ASSISTANT: None  PROCEDURE: Pap smear, hysteroscopy, dilation and curettage  ANESTHESIA: LMA and paracervical block   ESTIMATED BLOOD LOSS: 72mL  DRAINS: no I/O cath done   TOTAL IV FLUIDS: per anesthesia note  SPECIMENS: pap smear. endometrial polyp and curettings.   VTE PROPHYLAXIS: SCDs to the bilateral lower extremities  ANTIBIOTICS: not indicated  FLUID DEFICIT: [id number]  COMPLICATIONS: None  DISPOSITION: PACU - hemodynamically stable.  CONDITION: stable  FINDINGS: Exam under anesthesia revealed small, mobile, retroverted uterus with no masses and bilateral adnexa without masses or fullness. Hysteroscopy revealed a llarge endometrial polyp approximately 2cm just past the internal os. Otherwise grossly normal appearing uterine cavity with bilateral tubal ostia and normal appearing endocervical canal. Patient sounded to 9cm All polyp removed when looking at the end of the case.   PROCEDURE IN DETAIL:  After informed consent was obtained, the patient was taken to the operating room where anesthesia was obtained without difficulty. The patient was positioned in the dorsal lithotomy position in [place].   The patient was examined under anesthesia, with the above noted findings.  The bi-valved speculum was placed inside the patient's vagina, and a pap smear done. Next, the anterior lip of the cervix was seen and grasped with the tenaculum.  A paracervical block was achieved with 30mL 1% lidocaine.  The uterine cavity was sounded to 8cm but the bend felt retroverted. Tenaculum placed on posterior lip and patient resounded to 9cm. The cervix was progressively dilated to a 19 French-Pratt dilator.  The hysteroscope was introduced, with the above noted findings and large polyp noted at the end  of the scope. This was brought out and placed on tefla and some noted to be removed via the scope's suction.   The hystersocope was removed and the uterine cavity was curetted until a gritty texture was noted, yielding moderate amount of endometrial curettings that appeared polypoid. Given that all polyps noted to be gone at the end of the case without need for Myosure, no myosure done.  Scope was removed, and excellent hemostasis was noted, and all instruments were removed, with excellent hemostasis noted throughout.  She was then taken out of dorsal lithotomy. The patient tolerated the procedure well.  Sponge, lap and instrument counts were correct x2.  The patient was taken to recovery room in excellent condition.  Durene Romans MD Attending Center for [employer] [facility] farm [employer])

## 2019-01-17 NOTE — Anesthesia Procedure Notes (Signed)
Procedure Name: LMA Insertion Date/Time: 01/17/2019 2:29 PM Performed by: Genelle Bal, CRNA Pre-anesthesia Checklist: Patient identified, Emergency Drugs available, Suction available and Patient being monitored Patient Re-evaluated:Patient Re-evaluated prior to induction Oxygen Delivery Method: Circle system utilized Preoxygenation: Pre-oxygenation with 100% oxygen Induction Type: IV induction Ventilation: Mask ventilation without difficulty LMA: LMA inserted LMA Size: 4.0 Number of attempts: 1 Airway Equipment and Method: Bite block Placement Confirmation: positive ETCO2 Tube secured with: Tape Dental Injury: Teeth and Oropharynx as per pre-operative assessment

## 2019-01-17 NOTE — Transfer of Care (Signed)
Immediate Anesthesia Transfer of Care Note  Patient: Melissa Griffin  Procedure(s) Performed: DILATATION & CURETTAGE/HYSTEROSCOPY  AND PAP SMEAR (N/A )  Patient Location: PACU  Anesthesia Type:General  Level of Consciousness: drowsy and patient cooperative  Airway & Oxygen Therapy: Patient Spontanous Breathing and Patient connected to face mask oxygen  Post-op Assessment: Report given to RN and Post -op Vital signs reviewed and stable  Post vital signs: Reviewed and stable  Last Vitals:  Vitals Value Taken Time  BP 108/58   Temp    Pulse 66 01/17/19 1513  Resp 14 01/17/19 1513  SpO2 100 % 01/17/19 1513  Vitals shown include unvalidated device data.  Last Pain:  Vitals:   01/17/19 1113  PainSc: 0-No pain         Complications: No apparent anesthesia complications

## 2019-01-17 NOTE — Anesthesia Postprocedure Evaluation (Signed)
Anesthesia Post Note  Patient: Melissa Griffin  Procedure(s) Performed: DILATATION & CURETTAGE/HYSTEROSCOPY  AND PAP SMEAR (N/A )     Patient location during evaluation: PACU Anesthesia Type: General Level of consciousness: awake and alert Pain management: pain level controlled Vital Signs Assessment: post-procedure vital signs reviewed and stable Respiratory status: spontaneous breathing, nonlabored ventilation and respiratory function stable Cardiovascular status: blood pressure returned to baseline and stable Postop Assessment: no apparent nausea or vomiting Anesthetic complications: no    Last Vitals:  Vitals:   01/17/19 1530 01/17/19 1545  BP: 109/69 111/70  Pulse: 68 65  Resp: 16 14  Temp:    SpO2: 97% 96%    Last Pain:  Vitals:   01/17/19 1545  PainSc: 0-No pain                 Arnaldo Heffron A.

## 2019-01-17 NOTE — Anesthesia Preprocedure Evaluation (Addendum)
Anesthesia Evaluation  Patient identified by MRN, date of birth, ID band Patient awake    Reviewed: Allergy & Precautions, NPO status , Patient's Chart, lab work & pertinent test results  Airway Mallampati: II  TM Distance: >3 FB Neck ROM: Full    Dental no notable dental hx. (+) Teeth Intact Protruding incisors:   Pulmonary neg pulmonary ROS,    Pulmonary exam normal breath sounds clear to auscultation       Cardiovascular negative cardio ROS Normal cardiovascular exam Rhythm:Regular Rate:Normal     Neuro/Psych PSYCHIATRIC DISORDERS Schizophrenia Schizoaffective disorder with psychosis MRnegative neurological ROS     GI/Hepatic negative GI ROS, Neg liver ROS,   Endo/Other  Morbid obesity  Renal/GU negative Renal ROS  negative genitourinary   Musculoskeletal negative musculoskeletal ROS (+)   Abdominal (+) + obese,   Peds  Hematology negative hematology ROS (+)   Anesthesia Other Findings   Reproductive/Obstetrics Dysmenorrhea Menorrhagia Endometrial polyp                            Anesthesia Physical Anesthesia Plan  ASA: III  Anesthesia Plan: General   Post-op Pain Management:    Induction: Intravenous  PONV Risk Score and Plan: 4 or greater and Midazolam, Ondansetron, Dexamethasone and Treatment may vary due to age or medical condition  Airway Management Planned: LMA  Additional Equipment:   Intra-op Plan:   Post-operative Plan: Extubation in OR  Informed Consent: I have reviewed the patients History and Physical, chart, labs and discussed the procedure including the risks, benefits and alternatives for the proposed anesthesia with the patient or authorized representative who has indicated his/her understanding and acceptance.     Dental advisory given  Plan Discussed with: CRNA  Anesthesia Plan Comments:         Anesthesia Quick Evaluation

## 2019-01-17 NOTE — H&P (Signed)
Obstetrics & Gynecology Surgery H&P   Date of Admission: 01/17/2019   [facility] Primary Care Provider: Bosie Closice, Kathleen M  Reason for Admission: scheduled surgery  History of Present Illness: Ms. Melissa Griffin is a 28 y.o. G0P0000 (Patient's last menstrual period was 01/02/2019 (exact date).), with the above CC. PMHx is significant for BMI 40s, developmental delay.    Patient seen by me on 10/12 for new patient visit for menorrhagia and dysmenorrhea. Work up showed likely endometrial polyp on u/s.  No current VB.  ROS: A 12-point review of systems was performed and negative, except as stated in the above HPI.  OBGYN History: As per HPI. OB History  Gravida Para Term Preterm AB Living  0 0 0 0 0 0  SAB TAB Ectopic Multiple Live Births  0 0 0 0 0   History of pap smears: No. She is currently using abstinence for contraception.    Past Medical History: Past Medical History:  Diagnosis Date  . MR (mental retardation)   . Schizo-affective psychosis (HCC)     Past Surgical History: Past Surgical History:  Procedure Laterality Date  . CHOLECYSTECTOMY      Family History:  History reviewed. No pertinent family history.  Social History:  Social History   Socioeconomic History  . Marital status: Single    Spouse name: Not on file  . Number of children: 0  . Years of education: Not on file  . Highest education level: Not on file  Occupational History  . Not on file  Tobacco Use  . Smoking status: Never Smoker  . Smokeless tobacco: Never Used  Substance and Sexual Activity  . Alcohol use: No  . Drug use: No  . Sexual activity: Yes    Birth control/protection: None  Other Topics Concern  . Not on file  Social History Narrative   Patient is followed by [facility].       Patient lives in something similar to a group home type setting.    Social Determinants of Health   Financial Resource  Strain: Low Risk   . Difficulty of Paying Living Expenses: Not hard at all  Food Insecurity: No Food Insecurity  . Worried About Programme researcher, broadcasting/film/videounning Out of Food in the Last Year: Never true  . Ran Out of Food in the Last Year: Never true  Transportation Needs: No Transportation Needs  . Lack of Transportation (Medical): No  . Lack of Transportation (Non-Medical): No  Physical Activity: Insufficiently Active  . Days of Exercise per Week: 3 days  . Minutes of Exercise per Session: 30 min  Stress: No Stress Concern Present  . Feeling of Stress : Only a little  Social Connections:   . Frequency of Communication with Friends and Family: Not on file  . Frequency of Social Gatherings with Friends and Family: Not on file  . Attends Religious Services: Not on file  . Active Member of Clubs or Organizations: Not on file  . Attends BankerClub or Organization Meetings: Not on file  . Marital Status: Not on file  Intimate Partner Violence: Not At Risk  . Fear of Current or Ex-Partner: No  . Emotionally Abused: No  . Physically Abused: No  . Sexually Abused: No     Allergy: No Known Allergies  Current Outpatient Medications: Medications Prior to Admission  Medication Sig Dispense Refill Last Dose  . ARIPiprazole (ABILIFY PO) Take by mouth.   01/17/2019 at Unknown time  .  cloNIDine (CATAPRES) 0.2 MG tablet Take 0.2 mg by mouth 2 (two) times daily.   01/17/2019 at 0900  . ferrous sulfate 325 (65 FE) MG EC tablet Take 325 mg by mouth 3 (three) times daily with meals.   01/17/2019 at 0900  . FLUoxetine (PROZAC) 20 MG capsule Take 20 mg by mouth daily.   01/17/2019 at 0800  . fluPHENAZine (PROLIXIN) 5 MG tablet Take 5 mg by mouth daily.   01/16/2019 at Unknown time  . FLUVOXAMINE MALEATE PO Take 50 mg by mouth.   01/12/2019 at Unknown time  . hydrOXYzine (ATARAX/VISTARIL) 25 MG tablet Take 1 tablet (25 mg total) by mouth 3 (three) times daily as needed. 30 tablet 0 01/17/2019 at 0800  . oxcarbazepine (TRILEPTAL)  600 MG tablet Take 600 mg by mouth 2 (two) times daily.   01/17/2019 at 0800  . traZODone (DESYREL) 150 MG tablet Take by mouth at bedtime.   01/16/2019 at Unknown time  . Vitamin D, Cholecalciferol, 10 MCG (400 UNIT) TABS Take by mouth.   01/17/2019 at [facility] Medications: Current Facility-Administered Medications  Medication Dose Route Frequency Provider Last Rate Last Admin  . lactated ringers infusion   Intravenous Continuous Lillia Abed, MD 10 mL/hr at 01/17/19 1137 New Bag at 01/17/19 1137  . lactated ringers infusion   Intravenous Continuous Aletha Halim, MD         Physical Exam:   Current Vital Signs 24h Vital Sign Ranges  T (!) 97.1 F (36.2 C) Temp  Avg: 97.1 F (36.2 C)  Min: 97.1 F (36.2 C)  Max: 97.1 F (36.2 C)  BP 131/81 BP  Min: 131/81  Max: 131/81  HR 77 Pulse  Avg: 77  Min: 77  Max: 77  RR 20 Resp  Avg: 20  Min: 20  Max: 20  SaO2 99 % Room Air SpO2  Avg: 99 %  Min: 99 %  Max: 99 %       24 Hour I/O Current Shift I/O  Time Ins Outs No intake/output data recorded. No intake/output data recorded.    Body mass index is 47.36 kg/m. General appearance: Well nourished, well developed female in no acute distress.  Cardiovascular: S1, S2 normal, no murmur, rub or gallop, regular rate and rhythm Respiratory:  Clear to auscultation bilateral. Normal respiratory effort Abdomen: positive bowel sounds and no masses, hernias; diffusely non tender to palpation, non distended Neuro/Psych:  Normal mood and affect.  Skin:  Warm and dry.  Extremities: no clubbing, cyanosis, or edema.    Laboratory: UPT, COVID: neg   Imaging:  CLINICAL DATA:  Dysmenorrhea, menorrhagia with regular cycle  EXAM: ULTRASOUND PELVIS TRANSVAGINAL  TECHNIQUE: Transvaginal ultrasound examination of the pelvis was performed including evaluation of the uterus, ovaries, adnexal regions, and pelvic cul-de-sac.  COMPARISON:  None  FINDINGS: Uterus  Measurements:  8.0 x 4.0 x 4.9 cm = volume: 81 mL. Anteverted. Normal morphology without mass  Endometrium  Thickness: 14 mm. Focal hyperechoic nodule identified at the upper uterine segment endometrial canal measuring 19 x 8 x 8 mm. Lesion demonstrates entering blood vessels on color Doppler imaging. Finding is highly suspicious for an endometrial polyp.  Right ovary  Measurements: 3.9 x 3.0 x 2.4 cm = volume: 14.7 mL. Dominant follicle without mass  Left ovary  Measurements: 3.8 x 1.6 x 2.4 cm = volume: 8.0 mL. Normal morphology without mass  Other findings:  No free pelvic fluid.  No adnexal masses.  IMPRESSION:  Probable endometrial polyp 19 x 8 x 8 mm.  Consider further evaluation with sonohysterogram for confirmation prior to hysteroscopy. Endometrial sampling should also be considered if patient is at high risk for endometrial carcinoma. (Ref: Radiological Reasoning: Algorithmic Workup of Abnormal Vaginal Bleeding with Endovaginal Sonography and Sonohysterography. AJR 2008; [id number]:Z76-73)   Electronically Signed   By: Ulyses Southward M.D.   On: 12/07/2018 13:32  Assessment: Melissa Griffin is a 28 y.o. G0P0000 (Patient's last menstrual period was 01/02/2019 (exact date).), pt stable Plan: Pt and guardian consented for hysteroscopy, d&c, polypectomy, pap smear Can proceed when OR is ready  Cornelia Copa MD Attending Center for [facility])

## 2019-01-17 NOTE — Discharge Instructions (Addendum)
   We will discuss your surgery once again in detail at your post-op visit in two to four weeks. If you haven't already done so, please call to make your appointment as soon as possible.  Dilation and Curettage or Vacuum Curettage, Care After These instructions give you information on caring for yourself after your procedure. Your doctor may also give you more specific instructions. Call your doctor if you have any problems or questions after your procedure. HOME CARE  Do not drive for 24 hours.  Wait 1 week before doing any activities that wear you out.  Do not stand for a long time.  Limit stair climbing to once or twice a day.  Rest often.  Continue with your usual diet.  Drink enough fluids to keep your pee (urine) clear or pale yellow.  If you have a hard time pooping (constipation), you may:  Take a medicine to help you go poop (laxative) as told by your doctor.  Eat more fruit and bran.  Drink more fluids.  Take showers, not baths, for as long as told by your doctor.  Do not swim or use a hot tub until your doctor says it is okay.  Have someone with you for 1day after the procedure.  Do not douche, use tampons, or have sex (intercourse) until seen by your doctor  Only take medicines as told by your doctor. Do not take aspirin. It can cause bleeding.  Keep all doctor visits. GET HELP IF:  You have cramps or pain not helped by medicine.  You have new pain in the belly (abdomen).  You have a bad smelling fluid coming from your vagina.  You have a rash.  You have problems with any medicine. GET HELP RIGHT AWAY IF:   You start to bleed more than a regular period.  You have a fever.  You have chest pain.  You have trouble breathing.  You feel dizzy or feel like passing out (fainting).  You pass out.  You have pain in the tops of your shoulders.  You have vaginal bleeding with or without clumps of blood (blood clots). MAKE SURE YOU:  Understand  these instructions.  Will watch your condition.  Will get help right away if you are not doing well or get worse. Document Released: 10/28/2007 Document Revised: 01/23/2013 Document Reviewed: 08/17/2012 [facility] Patient Information 2015 [place], [place]. This information is not intended to replace advice given to you by your health care provider. Make sure you discuss any questions you have with your health care provider.  No Ibuprofen until 9 pm

## 2019-01-18 ENCOUNTER — Encounter: Payer: Self-pay | Admitting: *Deleted

## 2019-01-18 LAB — SURGICAL PATHOLOGY

## 2019-01-21 ENCOUNTER — Other Ambulatory Visit: Payer: Self-pay | Admitting: Obstetrics and Gynecology

## 2019-01-23 ENCOUNTER — Encounter: Payer: Self-pay | Admitting: *Deleted

## 2019-01-23 LAB — CYTOLOGY - PAP
Comment: NEGATIVE
Diagnosis: UNDETERMINED — AB
High risk HPV: NEGATIVE

## 2019-02-23 ENCOUNTER — Telehealth: Payer: Medicaid Other | Admitting: Obstetrics and Gynecology

## 2019-02-23 ENCOUNTER — Other Ambulatory Visit: Payer: Self-pay

## 2019-02-23 DIAGNOSIS — Z91199 Patient's noncompliance with other medical treatment and regimen due to unspecified reason: Secondary | ICD-10-CM

## 2019-02-23 DIAGNOSIS — Z5329 Procedure and treatment not carried out because of patient's decision for other reasons: Secondary | ICD-10-CM

## 2019-02-23 NOTE — Progress Notes (Signed)
Called pt at [phone]; VM box is full.   Called pt at 1100. Pt's caretaker answered, stated she was not currently with pt. Reports pt is in quarantine due to positive COVID-test until 02/27/19. Explained to caretaker we will reach out to reschedule appt.  Front office notified to reschedule appt in 2 weeks per [name], MD.  Fleet Contras RN 02/23/19

## 2019-02-26 ENCOUNTER — Telehealth: Payer: Self-pay | Admitting: Obstetrics and Gynecology

## 2019-02-26 NOTE — Progress Notes (Signed)
Patient seen and assessed by nursing staff during this encounter. I have reviewed the chart and agree with the documentation and plan.

## 2019-02-26 NOTE — Telephone Encounter (Signed)
Attempted to call patient about an appointment. When I called her, she hung up on me.

## 2019-02-26 NOTE — Telephone Encounter (Signed)
-----   Message from Marjo Bicker, RN sent at 02/23/2019 11:04 AM EST ----- Regarding: reschedule post op Please reschedule for in-person appt in 2 weeks per [name], MD. Pt will be out of quarantine 02/27/19 according to her caretaker. Thanks!

## 2019-03-14 ENCOUNTER — Ambulatory Visit: Payer: Medicaid Other | Admitting: Obstetrics and Gynecology

## 2019-03-14 ENCOUNTER — Telehealth: Payer: Medicaid Other | Admitting: Obstetrics and Gynecology

## 2019-04-02 ENCOUNTER — Ambulatory Visit: Payer: Medicaid Other | Admitting: Obstetrics and Gynecology

## 2019-04-02 ENCOUNTER — Encounter: Payer: Self-pay | Admitting: Obstetrics and Gynecology

## 2019-04-02 ENCOUNTER — Encounter: Payer: Self-pay | Admitting: Family Medicine

## 2019-04-02 NOTE — Progress Notes (Signed)
Patient did not keep her GYN appointment for 04/02/2019.  Cornelia Copa MD Attending Center for [employer] Midwife)

## 2019-04-11 ENCOUNTER — Ambulatory Visit (INDEPENDENT_AMBULATORY_CARE_PROVIDER_SITE_OTHER): Payer: Medicaid Other | Admitting: Obstetrics and Gynecology

## 2019-04-11 ENCOUNTER — Encounter: Payer: Self-pay | Admitting: Obstetrics and Gynecology

## 2019-04-11 VITALS — BP 111/64 | HR 67 | Wt 303.0 lb

## 2019-04-11 DIAGNOSIS — Z9889 Other specified postprocedural states: Secondary | ICD-10-CM

## 2019-04-11 DIAGNOSIS — Z30013 Encounter for initial prescription of injectable contraceptive: Secondary | ICD-10-CM | POA: Diagnosis not present

## 2019-04-11 DIAGNOSIS — Z8616 Personal history of COVID-19: Secondary | ICD-10-CM

## 2019-04-11 MED ORDER — MEDROXYPROGESTERONE ACETATE 150 MG/ML IM SUSP
150.0000 mg | Freq: Once | INTRAMUSCULAR | Status: AC
  Administered 2019-04-11: 150 mg via INTRAMUSCULAR

## 2019-04-11 NOTE — Addendum Note (Signed)
Addended by: Ernestina Patches on: 04/11/2019 03:53 PM   Modules accepted: Orders

## 2019-04-11 NOTE — Progress Notes (Signed)
[facility] 04/11/2019  CC: regular post op visit  Subjective:     Melissa Griffin is a 29 y.o. s/p 01/17/2019 pap smear, hysteroscopy, d&c for menorrhagia, dysmenorrhea and endometrial polyp; she was discharged from the PACU. Pap smear was ASCUS but HPV negative and her d&c showed a benign endometrial polyp .  She missed a few visits and had COVID in the interim. She is accompanied by her guardian.  Periods are qmonth, regular, 7 days and somewhat heavy and painful. Prior to surgery, the periods were prolonged about 2 weeks and very heavy and painful  LMP two weeks ago. No bleeding or pain today.   Review of Systems Pertinent items are noted in HPI.    Objective:    BP 111/64   Pulse 67   Wt (!) 303 lb (137.4 kg)   BMI 46.07 kg/m  General:  alert     Assessment:    Doing well postoperatively. Operative findings again reviewed. Pathology report discussed.    Plan:   I told her that she could do nothing or medication management for her periods and they would like to do medication management. Depo provera or Mirena IUD d/w them and they are amenable to Depo provera. Will give first injection today and RTC in 70m to see how she's doing and possible rpt depo provera.  Will need repeat pap smear in 3 years  Section Bing, Montez Hageman MD Attending Center for [employer] (Faculty Practice) 04/11/2019

## 2019-06-27 ENCOUNTER — Ambulatory Visit: Payer: Medicaid Other

## 2019-08-16 ENCOUNTER — Ambulatory Visit (INDEPENDENT_AMBULATORY_CARE_PROVIDER_SITE_OTHER): Payer: Medicaid Other

## 2019-08-16 ENCOUNTER — Other Ambulatory Visit: Payer: Self-pay

## 2019-08-16 VITALS — BP 113/76 | HR 73 | Wt 284.7 lb

## 2019-08-16 DIAGNOSIS — Z3042 Encounter for surveillance of injectable contraceptive: Secondary | ICD-10-CM

## 2019-08-16 DIAGNOSIS — Z3202 Encounter for pregnancy test, result negative: Secondary | ICD-10-CM | POA: Diagnosis not present

## 2019-08-16 LAB — POCT PREGNANCY, URINE: Preg Test, Ur: NEGATIVE

## 2019-08-16 NOTE — Progress Notes (Signed)
Dairl Ponder here for Depo-Provera  Injection. Pt is late for injection. Denies unprotected intercourse in the past 2 weeks. UPT today is negative. Injection administered without complication. Patient will return in 3 months for next injection.  Marjo Bicker, RN 08/16/2019  3:03 PM

## 2019-08-17 MED ORDER — MEDROXYPROGESTERONE ACETATE 150 MG/ML IM SUSP
150.0000 mg | Freq: Once | INTRAMUSCULAR | Status: AC
  Administered 2019-08-16: 150 mg via INTRAMUSCULAR

## 2019-08-19 NOTE — Progress Notes (Signed)
Patient was assessed and managed by nursing staff during this encounter. I have reviewed the chart and agree with the documentation and plan. I have also made any necessary editorial changes.  Warden Fillers, MD 08/19/2019 9:19 AM

## 2019-09-08 ENCOUNTER — Encounter (HOSPITAL_BASED_OUTPATIENT_CLINIC_OR_DEPARTMENT_OTHER): Payer: Self-pay | Admitting: *Deleted

## 2019-09-08 ENCOUNTER — Other Ambulatory Visit: Payer: Self-pay

## 2019-09-08 ENCOUNTER — Emergency Department (HOSPITAL_BASED_OUTPATIENT_CLINIC_OR_DEPARTMENT_OTHER)
Admission: EM | Admit: 2019-09-08 | Discharge: 2019-09-09 | Disposition: A | Discharge status: Home / Self Care | Payer: Medicaid Other | Attending: Emergency Medicine | Admitting: Emergency Medicine

## 2019-09-08 DIAGNOSIS — R1110 Vomiting, unspecified: Secondary | ICD-10-CM | POA: Insufficient documentation

## 2019-09-08 DIAGNOSIS — R112 Nausea with vomiting, unspecified: Secondary | ICD-10-CM

## 2019-09-08 DIAGNOSIS — R197 Diarrhea, unspecified: Secondary | ICD-10-CM | POA: Insufficient documentation

## 2019-09-08 LAB — URINALYSIS, ROUTINE W REFLEX MICROSCOPIC
Bilirubin Urine: NEGATIVE
Glucose, UA: NEGATIVE mg/dL
Ketones, ur: NEGATIVE mg/dL
Leukocytes,Ua: NEGATIVE
Nitrite: NEGATIVE
Protein, ur: NEGATIVE mg/dL
Specific Gravity, Urine: <1.005 — ABNORMAL LOW (ref 1.005–1.030)
pH: 6 (ref 5.0–8.0)

## 2019-09-08 LAB — URINALYSIS, MICROSCOPIC (REFLEX)

## 2019-09-08 LAB — PREGNANCY, URINE: Preg Test, Ur: NEGATIVE

## 2019-09-08 MED ORDER — ONDANSETRON 4 MG PO TBDP
8.0000 mg | ORAL_TABLET | Freq: Once | ORAL | Status: AC
  Administered 2019-09-09: 8 mg via ORAL
  Filled 2019-09-08: qty 2

## 2019-09-08 NOTE — ED Triage Notes (Signed)
Pt reports she has thrown up 5 times since yesterday. She has noticed blood mixed in her emesis

## 2019-09-09 ENCOUNTER — Emergency Department (HOSPITAL_BASED_OUTPATIENT_CLINIC_OR_DEPARTMENT_OTHER): Payer: Medicaid Other

## 2019-09-09 ENCOUNTER — Encounter (HOSPITAL_BASED_OUTPATIENT_CLINIC_OR_DEPARTMENT_OTHER): Payer: Self-pay | Admitting: Emergency Medicine

## 2019-09-09 MED ORDER — ALUM & MAG HYDROXIDE-SIMETH 200-200-20 MG/5ML PO SUSP
30.0000 mL | Freq: Once | ORAL | Status: AC
  Administered 2019-09-09: 30 mL via ORAL
  Filled 2019-09-09: qty 30

## 2019-09-09 NOTE — ED Provider Notes (Signed)
[facility] EMERGENCY DEPARTMENT Provider Note   CSN: [id number] Arrival date & time: 09/08/19  2056     History Chief Complaint  Patient presents with  . Emesis    Melissa Griffin is a 29 y.o. female.  The history is provided by the patient and a parent.  Emesis Severity:  Mild Duration:  1 day Timing:  Rare Number of daily episodes:  5 Quality:  Stomach contents (last time had blood streaks) Able to tolerate:  Liquids and solids Progression:  Unchanged Chronicity:  New Recent urination:  Normal Context: not post-tussive   Relieved by:  Nothing Worsened by:  Nothing Ineffective treatments:  None tried Associated symptoms: diarrhea   Associated symptoms: no abdominal pain, no arthralgias, no chills, no cough, no sore throat and no URI   Risk factors: no alcohol use        Past Medical History:  Diagnosis Date  . MR (mental retardation)   . Schizo-affective psychosis [facility])     Patient Active Problem List   Diagnosis Date Noted  . History of COVID-19 04/11/2019  . Endometrial polyp 12/22/2018  . Morbid obesity (HCC) 11/13/2018  . Dysmenorrhea 11/13/2018  . Menorrhagia with regular cycle 11/13/2018    Past Surgical History:  Procedure Laterality Date  . CHOLECYSTECTOMY    . DILATATION & CURETTAGE/HYSTEROSCOPY WITH MYOSURE N/A 01/17/2019   Procedure: DILATATION & CURETTAGE/HYSTEROSCOPY  AND PAP SMEAR;  Surgeon: Clayton Bing, MD;  Location: [facility];  Service: Gynecology;  Laterality: N/A;     OB History    Gravida  0   Para  0   Term  0   Preterm  0   AB  0   Living  0     SAB  0   TAB  0   Ectopic  0   Multiple  0   Live Births  0           History reviewed. No pertinent family history.  Social History   Tobacco Use  . Smoking status: Never Smoker  . Smokeless tobacco: Never Used  Substance Use Topics  . Alcohol use: No  . Drug use: No    Home Medications Prior to Admission medications     Medication Sig Start Date End Date Taking? Authorizing Provider  acetaminophen (TYLENOL) 500 MG tablet Take 2 tablets (1,000 mg total) by mouth every 8 (eight) hours as needed for mild pain, moderate pain or fever (cramping; 2nd line). 01/17/19   [place] Bing, MD  cloNIDine (CATAPRES) 0.2 MG tablet Take 0.2 mg by mouth 2 (two) times daily.    [provider]  ferrous sulfate 325 (65 FE) MG EC tablet Take 325 mg by mouth 3 (three) times daily with meals.    Alver Fisher, RN  FLUoxetine (PROZAC) 20 MG capsule Take 20 mg by mouth daily.    [provider]  fluPHENAZine decanoate (PROLIXIN) 25 MG/ML injection Inject 50 mg into the muscle every 14 (fourteen) days.    [provider]  FLUVOXAMINE MALEATE PO Take 50 mg by mouth.    [provider]  hydrOXYzine (ATARAX/VISTARIL) 25 MG tablet Take 1 tablet (25 mg total) by mouth 3 (three) times daily as needed. 12/12/18   Eustace Moore, MD  ibuprofen (ADVIL) 600 MG tablet TAKE ONE TABLET BY MOUTH EVERY 6 HOURS AS NEEDED FOR FEVER, MILD PAIN, MODERATE PAIN OR CRAMPING 01/24/19   [place] Bing, MD  oxcarbazepine (TRILEPTAL) 600 MG tablet Take  600 mg by mouth 2 (two) times daily.    [provider]  traZODone (DESYREL) 150 MG tablet Take by mouth at bedtime.    [provider]  Vitamin D, Cholecalciferol, 10 MCG (400 UNIT) TABS Take by mouth.    Alver Fisher, RN  misoprostol (CYTOTEC) 200 MCG tablet Place two tabs ( ) in cheek for 30 minutes and then swallow the night before your appointment 11/13/18 12/12/18  [place] Bing, MD    Allergies    Patient has no known allergies.  Review of Systems   Review of Systems  Constitutional: Negative for chills.  HENT: Negative for sore throat.   Eyes: Negative for visual disturbance.  Respiratory: Negative for cough.   Cardiovascular: Negative for chest pain.  Gastrointestinal: Positive for diarrhea and vomiting. Negative for  abdominal pain.  Genitourinary: Negative for dysuria.  Musculoskeletal: Negative for arthralgias.  Skin: Negative for color change.  Neurological: Negative for dizziness.  Psychiatric/Behavioral: Negative for agitation.  All other systems reviewed and are negative.   Physical Exam Updated Vital Signs BP 120/73 (BP Location: Right Arm)   Pulse 75   Temp 98.2 F (36.8 C) (Oral)   Resp 17   Ht 5\' 7"  (1.702 m)   Wt 127.9 kg   LMP 09/08/2019 Comment: pt states her period started while in waiting room  SpO2 100%   BMI 44.17 kg/m   Physical Exam Vitals and nursing note reviewed.  Constitutional:      General: She is not in acute distress.    Appearance: Normal appearance.  HENT:     Head: Normocephalic and atraumatic.     Nose: Nose normal.  Eyes:     Conjunctiva/sclera: Conjunctivae normal.     Pupils: Pupils are equal, round, and reactive to light.  Cardiovascular:     Rate and Rhythm: Normal rate and regular rhythm.     Pulses: Normal pulses.     Heart sounds: Normal heart sounds.  Pulmonary:     Effort: Pulmonary effort is normal.     Breath sounds: Normal breath sounds.  Abdominal:     General: Abdomen is flat. Bowel sounds are normal.     Tenderness: There is no abdominal tenderness. There is no guarding or rebound.  Musculoskeletal:        General: Normal range of motion.     Cervical back: Normal range of motion and neck supple.  Skin:    General: Skin is warm and dry.     Capillary Refill: Capillary refill takes less than 2 seconds.  Neurological:     General: No focal deficit present.     Mental Status: She is alert and oriented to person, place, and time.     Deep Tendon Reflexes: Reflexes normal.  Psychiatric:        Mood and Affect: Mood normal.        Behavior: Behavior normal.     ED Results / Procedures / Treatments   Labs (all labs ordered are listed, but only abnormal results are displayed) Labs Reviewed  URINALYSIS, ROUTINE W REFLEX  MICROSCOPIC - Abnormal; Notable for the following components:      Result Value   Specific Gravity, Urine <1.005 (*)    Hgb urine dipstick LARGE (*)    All other components within normal limits  URINALYSIS, MICROSCOPIC (REFLEX) - Abnormal; Notable for the following components:   Bacteria, UA RARE (*)    All other components within normal limits  PREGNANCY, URINE  EKG None  Radiology DG Abdomen Acute W/Chest  Result Date: 09/09/2019 CLINICAL DATA:  Nausea and vomiting. Abdominal soreness. Sore throat. EXAM: DG ABDOMEN ACUTE W/ 1V CHEST COMPARISON:  Chest 02/06/2017 FINDINGS: Normal heart size and pulmonary vascularity. Lungs are clear. No pleural effusions. No pneumothorax. Mediastinal contours appear intact. Scattered gas and stool in the colon. No small or large bowel distention. No free intra-abdominal air. No abnormal air-fluid levels. No radiopaque stones. Surgical clips in the right upper quadrant. Calcified phleboliths in the pelvis. Visualized bones and soft tissue contours appear intact. IMPRESSION: No evidence of active pulmonary disease. Nonobstructive bowel gas pattern. Electronically Signed   By: Burman Nieves M.D.   On: 09/09/2019 00:35    Procedures Procedures (including critical care time)  Medications Ordered in ED Medications  ondansetron (ZOFRAN-ODT) disintegrating tablet 8 mg (8 mg Oral Given 09/09/19 0027)  alum & mag hydroxide-simeth (MAALOX/MYLANTA) 200-200-20 MG/5ML suspension 30 mL (30 mLs Oral Given 09/09/19 0113)    ED Course  I have reviewed the triage vital signs and the nursing notes.  Pertinent labs & imaging results that were available during my care of the patient were reviewed by me and considered in my medical decision making (see chart for details).    Exam and vitals are benign and reassuring.  Likely viral infection with mallory weiss tear.  No further Emesis.  Is hungry and PO challenged successfully.  No indication for imaging or labs at this  time.  Strict return precautions given.    Melissa Griffin was evaluated in Emergency Department on 09/09/2019 for the symptoms described in the history of present illness. She was evaluated in the context of the global COVID-19 pandemic, which necessitated consideration that the patient might be at risk for infection with the SARS-CoV-2 virus that causes COVID-19. Institutional protocols and algorithms that pertain to the evaluation of patients at risk for COVID-19 are in a state of rapid change based on information released by regulatory bodies including the CDC and federal and state organizations. These policies and algorithms were followed during the patient's care in the ED.  Final Clinical Impression(s) / ED Diagnoses Return for intractable cough, coughing up blood,fevers >100.4 unrelieved by medication, shortness of breath, intractable vomiting, chest pain, shortness of breath, weakness,numbness, changes in speech, facial asymmetry,abdominal pain, passing out,Inability to tolerate liquids or food, cough, altered mental status or any concerns. No signs of systemic illness or infection. The patient is nontoxic-appearing on exam and vital signs are within normal limits.   I have reviewed the triage vital signs and the nursing notes. Pertinent labs &imaging results that were available during my care of the patient were reviewed by me and considered in my medical decision making (see chart for details).After history, exam, and medical workup I feel the patient has beenappropriately medically screened and is safe for discharge home. Pertinent diagnoses were discussed with the patient. Patient was given return precautions.   Catalina Salasar, MD 09/09/19 [id number]

## 2019-09-09 NOTE — ED Notes (Signed)
Pt passed PO challenge.

## 2019-09-09 NOTE — ED Notes (Signed)
PO challenge given  

## 2019-09-09 NOTE — ED Notes (Signed)
Pt to Xray.

## 2019-11-01 ENCOUNTER — Ambulatory Visit (INDEPENDENT_AMBULATORY_CARE_PROVIDER_SITE_OTHER): Payer: Medicaid Other

## 2019-11-01 ENCOUNTER — Other Ambulatory Visit: Payer: Self-pay

## 2019-11-01 VITALS — BP 110/64 | HR 75 | Wt 282.8 lb

## 2019-11-01 DIAGNOSIS — Z3042 Encounter for surveillance of injectable contraceptive: Secondary | ICD-10-CM

## 2019-11-01 MED ORDER — MEDROXYPROGESTERONE ACETATE 150 MG/ML IM SUSP
150.0000 mg | Freq: Once | INTRAMUSCULAR | Status: AC
  Administered 2019-11-01: 150 mg via INTRAMUSCULAR

## 2019-11-01 NOTE — Progress Notes (Signed)
Agree with A & P. 

## 2019-11-01 NOTE — Progress Notes (Signed)
Melissa Griffin here with caretaker for Depo-Provera Injection. Injection administered without complication. Patient will return in 3 months for next injection.  PHQ-9 and GAD-7 positive today. History significant for PTSD, schizoaffective disorder, and intellectual disability. Pt currently managed by neuropsych; seen by therapist every Monday evening. Denies any thoughts of harm or SI today. Denies any plan for harm. Reviewed with Alysia Penna, MD who states pt may continue current plan of care.  Marjo Bicker, RN 11/01/2019  9:08 AM

## 2020-01-17 ENCOUNTER — Ambulatory Visit: Payer: Medicaid Other

## 2020-01-29 ENCOUNTER — Other Ambulatory Visit: Payer: Self-pay

## 2020-01-29 ENCOUNTER — Ambulatory Visit (INDEPENDENT_AMBULATORY_CARE_PROVIDER_SITE_OTHER): Payer: Medicaid Other

## 2020-01-29 VITALS — BP 116/75 | HR 73 | Wt 277.5 lb

## 2020-01-29 DIAGNOSIS — Z3042 Encounter for surveillance of injectable contraceptive: Secondary | ICD-10-CM

## 2020-01-29 MED ORDER — MEDROXYPROGESTERONE ACETATE 150 MG/ML IM SUSP
150.0000 mg | Freq: Once | INTRAMUSCULAR | Status: AC
  Administered 2020-01-29: 10:00:00 150 mg via INTRAMUSCULAR

## 2020-01-29 NOTE — Progress Notes (Signed)
Pt presents with elevated PHQ-9/GAD-7.  Pt mother reports that she is already receiving services from a psychiatrist.   Melissa Griffin here for Depo-Provera Injection. Injection administered without complication. Patient will return in 3 months for next injection between March 15th and March 29th. Next annual visit due 01/2022.   Ralene Bathe, RN 01/29/2020  9:49 AM

## 2020-04-15 ENCOUNTER — Other Ambulatory Visit: Payer: Self-pay

## 2020-04-15 ENCOUNTER — Ambulatory Visit (INDEPENDENT_AMBULATORY_CARE_PROVIDER_SITE_OTHER): Payer: Medicaid Other

## 2020-04-15 VITALS — BP 161/76 | HR 93 | Temp 98.5°F | Wt 267.1 lb

## 2020-04-15 DIAGNOSIS — Z3042 Encounter for surveillance of injectable contraceptive: Secondary | ICD-10-CM | POA: Diagnosis not present

## 2020-04-15 MED ORDER — MEDROXYPROGESTERONE ACETATE 150 MG/ML IM SUSP
150.0000 mg | Freq: Once | INTRAMUSCULAR | Status: AC
  Administered 2020-04-15: 150 mg via INTRAMUSCULAR

## 2020-04-15 NOTE — Progress Notes (Signed)
ATTESTATION OF SUPERVISION OF RN: Evaluation and management procedures were performed by the RN under my supervision and collaboration. I have reviewed the nursing note and chart and agree with the management and plan for this patient.  Contina Strain, CNM  

## 2020-04-15 NOTE — Progress Notes (Signed)
Melissa Griffin here for Depo-Provera Injection. Injection administered without complication. Patient will return in 3 months for next injection between 07/01/20  and 07/15/20. Next annual visit due this month; explained pt will need annual visit prior to receiving another Depo Provera injection. PAP smear due 01/16/22.   PHQ-9 and GAD-7 positive today; pt denies any plan for harm, endorses some SI. Pt has a hx significant for intellectual disability and schizoaffective disorder. Pt currently lives with 24 hour/day behavioral health care. Pt's care assistant is with her this visit.   Marjo Bicker, RN 04/15/2020  9:18 AM

## 2020-06-18 ENCOUNTER — Other Ambulatory Visit: Payer: Self-pay

## 2020-06-18 ENCOUNTER — Encounter (HOSPITAL_BASED_OUTPATIENT_CLINIC_OR_DEPARTMENT_OTHER): Payer: Self-pay | Admitting: Emergency Medicine

## 2020-06-18 ENCOUNTER — Emergency Department (HOSPITAL_BASED_OUTPATIENT_CLINIC_OR_DEPARTMENT_OTHER)
Admission: EM | Admit: 2020-06-18 | Discharge: 2020-06-19 | Disposition: A | Discharge status: Home / Self Care | Payer: Medicaid Other | Attending: Emergency Medicine | Admitting: Emergency Medicine

## 2020-06-18 DIAGNOSIS — Z9049 Acquired absence of other specified parts of digestive tract: Secondary | ICD-10-CM | POA: Insufficient documentation

## 2020-06-18 DIAGNOSIS — U071 COVID-19: Secondary | ICD-10-CM | POA: Insufficient documentation

## 2020-06-18 DIAGNOSIS — R1032 Left lower quadrant pain: Secondary | ICD-10-CM | POA: Diagnosis not present

## 2020-06-18 LAB — URINALYSIS, ROUTINE W REFLEX MICROSCOPIC
Bilirubin Urine: NEGATIVE
Glucose, UA: NEGATIVE mg/dL
Ketones, ur: NEGATIVE mg/dL
Nitrite: NEGATIVE
Protein, ur: NEGATIVE mg/dL
Specific Gravity, Urine: >1.03 — ABNORMAL HIGH (ref 1.005–1.030)
pH: 5.5 (ref 5.0–8.0)

## 2020-06-18 LAB — URINALYSIS, MICROSCOPIC (REFLEX)

## 2020-06-18 LAB — PREGNANCY, URINE: Preg Test, Ur: NEGATIVE

## 2020-06-18 NOTE — ED Triage Notes (Signed)
Pt c/o left side pain that started hurting approx 1 hrs. Pt states that the pain comes in waves. Pt denies any N/V. Pt tested positive for Covid on 06/12/2020. Pt denies any respiratory symptoms at this time. Pt aaox3, ambulatory with steady gait, VSS, GCS 15, NAD noted.

## 2020-06-19 ENCOUNTER — Emergency Department (HOSPITAL_BASED_OUTPATIENT_CLINIC_OR_DEPARTMENT_OTHER): Payer: Medicaid Other

## 2020-06-19 NOTE — ED Provider Notes (Signed)
[facility] Emergency Department Provider Note MRN:  [id number]  Arrival date & time: 06/19/20     Chief Complaint   Abdominal Pain and Covid Positive   History of Present Illness   Melissa Griffin is a 30 y.o. year-old female with a history of MR presenting to the ED with chief complaint of abdominal pain.  Location: Left lower quadrant Duration: 1 to 2 hours Onset: Sudden Timing: Constant Description: Sharp Severity: Was initially severe but is now very mild, almost gone Exacerbating/Alleviating Factors: None Associated Symptoms: None Pertinent Negatives: No fever, no nausea vomiting, no diarrhea, no chest pain   Review of Systems  A complete 10 system review of systems was obtained and all systems are negative except as noted in the HPI and PMH.   Patient's Health History    Past Medical History:  Diagnosis Date  . MR (mental retardation)   . Schizo-affective psychosis (HCC)     Past Surgical History:  Procedure Laterality Date  . CHOLECYSTECTOMY    . DILATATION & CURETTAGE/HYSTEROSCOPY WITH MYOSURE N/A 01/17/2019   Procedure: DILATATION & CURETTAGE/HYSTEROSCOPY  AND PAP SMEAR;  Surgeon: [place] Bing, MD;  Location: [facility];  Service: Gynecology;  Laterality: N/A;  . TONSILLECTOMY      No family history on file.  Social History   Socioeconomic History  . Marital status: Single    Spouse name: Not on file  . Number of children: 0  . Years of education: Not on file  . Highest education level: Not on file  Occupational History  . Not on file  Tobacco Use  . Smoking status: Never Smoker  . Smokeless tobacco: Never Used  Substance and Sexual Activity  . Alcohol use: No  . Drug use: No  . Sexual activity: Yes    Birth control/protection: None  Other Topics Concern  . Not on file  Social History Narrative   Patient is followed by [facility].       Patient lives in  something similar to a group home type setting.    Social Determinants of Health   Financial Resource Strain: Not on file  Food Insecurity: No Food Insecurity  . Worried About Programme researcher, broadcasting/film/video in the Last Year: Never true  . Ran Out of Food in the Last Year: Never true  Transportation Needs: No Transportation Needs  . Lack of Transportation (Medical): No  . Lack of Transportation (Non-Medical): No  Physical Activity: Not on file  Stress: Not on file  Social Connections: Not on file  Intimate Partner Violence: Not on file     Physical Exam   Vitals:   06/19/20 0011 06/19/20 0012  BP: 138/85 104/66  Pulse: 89 63  Resp: 18 18  Temp:  98.5 F (36.9 C)  SpO2: 99% 94%    CONSTITUTIONAL: Well-appearing, NAD NEURO:  Alert and oriented x 3, no focal deficits EYES:  eyes equal and reactive ENT/NECK:  no LAD, no JVD CARDIO: Regular rate, well-perfused, normal S1 and S2 PULM:  CTAB no wheezing or rhonchi GI/GU:  normal bowel sounds, non-distended, non-tender MSK/SPINE:  No gross deformities, no edema SKIN:  no rash, atraumatic PSYCH:  Appropriate speech and behavior  *Additional and/or pertinent findings included in MDM below  Diagnostic and Interventional Summary    EKG Interpretation  Date/Time:    Ventricular Rate:    PR Interval:    QRS Duration:   QT Interval:    QTC  Calculation:   R Axis:     Text Interpretation:        Labs Reviewed  URINALYSIS, ROUTINE W REFLEX MICROSCOPIC - Abnormal; Notable for the following components:      Result Value   Specific Gravity, Urine >1.030 (*)    Hgb urine dipstick TRACE (*)    Leukocytes,Ua SMALL (*)    All other components within normal limits  URINALYSIS, MICROSCOPIC (REFLEX) - Abnormal; Notable for the following components:   Bacteria, UA FEW (*)    All other components within normal limits  URINE CULTURE  PREGNANCY, URINE    CT RENAL STONE STUDY  Final Result      Medications - No data to display    Procedures  /  Critical Care Procedures  ED Course and Medical Decision Making  I have reviewed the triage vital signs, the nursing notes, and pertinent available records from the EMR.  Listed above are laboratory and imaging tests that I personally ordered, reviewed, and interpreted and then considered in my medical decision making (see below for details).  CT to evaluate for kidney stone, evidence of diverticulitis or ovarian pathology.  CT is reassuring, vital signs are normal, pain is largely resolved, suspect constipation, advised MiraLAX, appropriate for discharge.       Elmer Sow. Pilar Plate, MD [facility] Emergency Medicine [facility] [email or web address]  Final Clinical Impressions(s) / ED Diagnoses     ICD-10-CM   1. Left lower quadrant abdominal pain  R10.32     ED Discharge Orders    None       Discharge Instructions Discussed with and Provided to Patient:     Discharge Instructions     You were evaluated in the Emergency Department and after careful evaluation, we did not find any emergent condition requiring admission or further testing in the hospital.  Your exam/testing today was overall reassuring.  CT scan did not show any emergencies.  Recommend Tylenol or Motrin for pain.  Please return to the Emergency Department if you experience any worsening of your condition.  Thank you for allowing [place] to be a part of your care.       Sabas Sous, MD 06/19/20 [phone]

## 2020-06-19 NOTE — Discharge Instructions (Addendum)
You were evaluated in the Emergency Department and after careful evaluation, we did not find any emergent condition requiring admission or further testing in the hospital.  Your exam/testing today was overall reassuring.  CT scan did not show any emergencies.  Recommend Tylenol or Motrin for pain.  Please return to the Emergency Department if you experience any worsening of your condition.  Thank you for allowing [place] to be a part of your care.

## 2020-06-21 LAB — URINE CULTURE

## 2020-07-01 ENCOUNTER — Other Ambulatory Visit: Payer: Self-pay

## 2020-07-01 ENCOUNTER — Ambulatory Visit (INDEPENDENT_AMBULATORY_CARE_PROVIDER_SITE_OTHER): Payer: Medicaid Other | Admitting: Certified Nurse Midwife

## 2020-07-01 ENCOUNTER — Other Ambulatory Visit (HOSPITAL_COMMUNITY)
Admission: RE | Admit: 2020-07-01 | Discharge: 2020-07-01 | Disposition: A | Discharge status: Home / Self Care | Payer: Medicaid Other | Source: Ambulatory Visit | Attending: Certified Nurse Midwife | Admitting: Certified Nurse Midwife

## 2020-07-01 ENCOUNTER — Encounter: Payer: Self-pay | Admitting: Certified Nurse Midwife

## 2020-07-01 VITALS — BP 109/74 | HR 75 | Ht 68.0 in | Wt 261.3 lb

## 2020-07-01 DIAGNOSIS — Z124 Encounter for screening for malignant neoplasm of cervix: Secondary | ICD-10-CM

## 2020-07-01 DIAGNOSIS — Z3042 Encounter for surveillance of injectable contraceptive: Secondary | ICD-10-CM

## 2020-07-01 DIAGNOSIS — Z01419 Encounter for gynecological examination (general) (routine) without abnormal findings: Secondary | ICD-10-CM | POA: Diagnosis not present

## 2020-07-01 MED ORDER — MEDROXYPROGESTERONE ACETATE 150 MG/ML IM SUSP
150.0000 mg | Freq: Once | INTRAMUSCULAR | Status: AC
  Administered 2020-07-01: 150 mg via INTRAMUSCULAR

## 2020-07-01 NOTE — Progress Notes (Signed)
GYNECOLOGY CLINIC ANNUAL PREVENTATIVE CARE ENCOUNTER NOTE  Subjective:   Melissa Griffin is a 30 y.o. G0P0000 female here for a routine annual gynecologic exam.  Current complaints: None. Denies abnormal vaginal bleeding, discharge, pelvic pain, problems with intercourse or other gynecologic concerns.    Gynecologic History No LMP recorded. Patient has had an injection. Contraception: Depo-Provera injections Last Pap: 2020. Results were: abnormal Last mammogram: N/A.   Obstetric History OB History  Gravida Para Term Preterm AB Living  0 0 0 0 0 0  SAB IAB Ectopic Multiple Live Births  0 0 0 0 0   Past Medical History:  Diagnosis Date  . MR (mental retardation)   . Schizo-affective psychosis (HCC)    Past Surgical History:  Procedure Laterality Date  . CHOLECYSTECTOMY    . DILATATION & CURETTAGE/HYSTEROSCOPY WITH MYOSURE N/A 01/17/2019   Procedure: DILATATION & CURETTAGE/HYSTEROSCOPY  AND PAP SMEAR;  Surgeon: [place] Bing, MD;  Location: [facility];  Service: Gynecology;  Laterality: N/A;  . TONSILLECTOMY     Current Outpatient Medications on File Prior to Visit  Medication Sig Dispense Refill  . acetaminophen (TYLENOL) 500 MG tablet Take 2 tablets (1,000 mg total) by mouth every 8 (eight) hours as needed for mild pain, moderate pain or fever (cramping; 2nd line). 30 tablet 0  . cholestyramine (QUESTRAN) 4 g packet Take 1 packet by mouth daily.    . clonazePAM (KLONOPIN) 1 MG tablet Take 1 mg by mouth at bedtime.    . cloNIDine (CATAPRES) 0.2 MG tablet Take 0.2 mg by mouth 2 (two) times daily.    . fluPHENAZine decanoate (PROLIXIN) 25 MG/ML injection Inject 125 mg into the muscle every 30 (thirty) days.    Marland Kitchen FLUVOXAMINE MALEATE PO Take 75 mg by mouth.     . hydrOXYzine (ATARAX/VISTARIL) 25 MG tablet Take 1 tablet (25 mg total) by mouth 3 (three) times daily as needed. 30 tablet 0  . traZODone (DESYREL) 150 MG tablet Take by mouth at bedtime.    Marcie Bal Tosylate (INGREZZA) 40 MG CAPS Take 40 mg by mouth at bedtime.    . Vitamin D, Cholecalciferol, 10 MCG (400 UNIT) TABS Take 50 mcg by mouth in the morning and at bedtime.     . [DISCONTINUED] misoprostol (CYTOTEC) 200 MCG tablet Place two tabs ( ) in cheek for 30 minutes and then swallow the night before your appointment 2 tablet 0   No current facility-administered medications on file prior to visit.   No Known Allergies  Social History   Socioeconomic History  . Marital status: Single    Spouse name: Not on file  . Number of children: 0  . Years of education: Not on file  . Highest education level: Not on file  Occupational History  . Not on file  Tobacco Use  . Smoking status: Never Smoker  . Smokeless tobacco: Never Used  Substance and Sexual Activity  . Alcohol use: No  . Drug use: No  . Sexual activity: Yes    Birth control/protection: None  Other Topics Concern  . Not on file  Social History Narrative   Patient is followed by [facility].       Patient lives in something similar to a group home type setting.    Social Determinants of Health   Financial Resource Strain: Not on file  Food Insecurity: No Food Insecurity  . Worried About Programme researcher, broadcasting/film/video in the Last Year:  Never true  . Ran Out of Food in the Last Year: Never true  Transportation Needs: No Transportation Needs  . Lack of Transportation (Medical): No  . Lack of Transportation (Non-Medical): No  Physical Activity: Not on file  Stress: Not on file  Social Connections: Not on file  Intimate Partner Violence: Not on file   No family history on file.  The following portions of the patient's history were reviewed and updated as appropriate: allergies, current medications, past family history, past medical history, past social history, past surgical history and problem list.  Review of Systems Pertinent items noted in HPI and remainder of  comprehensive ROS otherwise negative.   Objective:  BP 109/74   Pulse 75   Ht 5\' 8"  (1.727 m)   Wt 261 lb 4.8 oz (118.5 kg)   BMI 39.73 kg/m  Constitutional: Well-developed, well-nourished pregnant female in no acute distress. Visibly under the influence/sleepy: eyes partially closed, slurred speech - mother in room, stated it was side effects from her psych meds HEENT: PERRLA Skin: normal color and turgor, no rash Cardiovascular: normal rate & rhythm, no murmur Respiratory: normal effort, lung sounds clear throughout GI: Abd soft, non-tender, pos BS x 4 MS: Extremities nontender, no edema, normal ROM Neurologic: Alert and oriented x 4.  GU: no CVA tenderness Pelvic: NEFG, physiologic discharge, no blood, cervix clean. Pap/swabs collected  Assessment:  Annual gynecologic examination with pap smear and vaginal STI testing   Plan:  Will follow up results of pap smear/STI testing and manage accordingly. Routine preventative health maintenance measures emphasized. Please refer to After Visit Summary for other counseling recommendations.    , CNM

## 2020-07-04 LAB — CYTOLOGY - PAP
Chlamydia: NEGATIVE
Comment: NEGATIVE
Comment: NEGATIVE
Comment: NEGATIVE
Comment: NORMAL
Diagnosis: NEGATIVE
High risk HPV: NEGATIVE
Neisseria Gonorrhea: NEGATIVE
Trichomonas: NEGATIVE

## 2020-09-16 ENCOUNTER — Ambulatory Visit: Payer: Medicaid Other

## 2021-01-14 ENCOUNTER — Emergency Department (HOSPITAL_BASED_OUTPATIENT_CLINIC_OR_DEPARTMENT_OTHER)
Admission: EM | Admit: 2021-01-14 | Discharge: 2021-01-14 | Disposition: A | Discharge status: Home / Self Care | Payer: Medicaid Other | Attending: Emergency Medicine | Admitting: Emergency Medicine

## 2021-01-14 ENCOUNTER — Encounter (HOSPITAL_BASED_OUTPATIENT_CLINIC_OR_DEPARTMENT_OTHER): Payer: Self-pay

## 2021-01-14 ENCOUNTER — Other Ambulatory Visit: Payer: Self-pay

## 2021-01-14 DIAGNOSIS — Z8616 Personal history of COVID-19: Secondary | ICD-10-CM | POA: Insufficient documentation

## 2021-01-14 DIAGNOSIS — X58XXXA Exposure to other specified factors, initial encounter: Secondary | ICD-10-CM | POA: Diagnosis not present

## 2021-01-14 DIAGNOSIS — T161XXA Foreign body in right ear, initial encounter: Secondary | ICD-10-CM | POA: Diagnosis not present

## 2021-01-14 HISTORY — DX: Gastro-esophageal reflux disease without esophagitis: K21.9

## 2021-01-14 MED ORDER — LIDOCAINE HCL (PF) 1 % IJ SOLN
INTRAMUSCULAR | Status: AC
  Filled 2021-01-14: qty 5

## 2021-01-14 NOTE — ED Provider Notes (Signed)
[facility] Provider Note   CSN: [id number] Arrival date & time: 01/14/21  1953     History Chief Complaint  Patient presents with   Foreign Body in [place] is a 30 y.o. female.  30 yo F with a cc of a bug to the right ear.  This is been there for at least couple days.  Feels like she can feel something moving in there.  Has not tried thing to get it out.  She named the bug Otila Kluver.  The history is provided by the patient.  Foreign Body in Ear This is a new problem. The current episode started 2 days ago. The problem occurs constantly. The problem has not changed since onset.Pertinent negatives include no chest pain, no headaches and no shortness of breath. Nothing aggravates the symptoms. Nothing relieves the symptoms. She has tried nothing for the symptoms. The treatment provided no relief.      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   Seizure, epileptic ([place]) 03/21/2015   Schizoaffective disorder ([place]) 03/21/2015   Esophageal reflux disease 03/21/2015    Past Surgical History:  Procedure Laterality Date   CHOLECYSTECTOMY     DILATATION & CURETTAGE/HYSTEROSCOPY WITH MYOSURE N/A 01/17/2019   Procedure: DILATATION & CURETTAGE/HYSTEROSCOPY  AND PAP SMEAR;  Surgeon: Aletha Halim, MD;  Location: [facility];  Service: Gynecology;  Laterality: N/A;   TONSILLECTOMY       OB History     Gravida  0   Para  0   Term  0   Preterm  0   AB  0   Living  0      SAB  0   IAB  0   Ectopic  0   Multiple  0   Live Births  0            History reviewed. No pertinent family history.  Social History   Tobacco Use   Smoking status: Never   Smokeless tobacco: Never  Vaping Use   Vaping Use: Never used  Substance Use Topics   Alcohol use: No   Drug use: No    Home Medications Prior to Admission medications   Medication Sig Start Date End Date Taking? Authorizing Provider  acetaminophen (TYLENOL) 500 MG tablet Take 2 tablets (1,000 mg total) by mouth every 8 (eight) hours as needed for mild pain, moderate pain or fever (cramping; 2nd line). 01/17/19   Aletha Halim, MD  cholestyramine Lucrezia Starch) 4 g packet Take 1 packet by mouth daily. 04/01/20   [provider]  clonazePAM (KLONOPIN) 1 MG tablet Take 1 mg by mouth at bedtime.    [provider]  cloNIDine (CATAPRES) 0.2 MG tablet Take 0.2 mg by mouth 2 (two) times daily.    [provider]  fluPHENAZine decanoate (PROLIXIN) 25 MG/ML injection Inject 125 mg into the muscle every 30 (thirty) days.    [provider]  FLUVOXAMINE MALEATE PO Take 75 mg by mouth.     [provider]  hydrOXYzine (ATARAX/VISTARIL) 25 MG tablet Take 1 tablet (25 mg  total) by mouth 3 (three) times daily as needed. 12/12/18   Raylene Everts, MD  traZODone (DESYREL) 150 MG tablet Take by mouth at bedtime.    [provider]  Valbenazine Tosylate (INGREZZA) 40 MG CAPS Take 40 mg by mouth at bedtime.    [provider]  Vitamin D, Cholecalciferol, 10 MCG (400 UNIT) TABS Take 50 mcg by mouth in the morning and at bedtime.     Joline Salt, RN  misoprostol (CYTOTEC) 200 MCG tablet Place two tabs (420mcg) in cheek for 30 minutes and then swallow the night before your appointment 11/13/18 12/12/18  Aletha Halim, MD    Allergies    Patient has no known allergies.  Review of Systems   Review of Systems  Constitutional:  Negative for chills and fever.  HENT:  Positive for ear pain. Negative for congestion and  rhinorrhea.   Eyes:  Negative for redness and visual disturbance.  Respiratory:  Negative for shortness of breath and wheezing.   Cardiovascular:  Negative for chest pain and palpitations.  Gastrointestinal:  Negative for nausea and vomiting.  Genitourinary:  Negative for dysuria and urgency.  Musculoskeletal:  Negative for arthralgias and myalgias.  Skin:  Negative for pallor and wound.  Neurological:  Negative for dizziness and headaches.   Physical Exam Updated Vital Signs BP (!) 131/92 (BP Location: Right Arm)    Pulse 84    Temp 99.3 F (37.4 C) (Oral)    Resp 18    Ht 5\' 8"  (1.727 m)    Wt 123.8 kg    SpO2 98%    BMI 41.51 kg/m   Physical Exam Vitals and nursing note reviewed.  Constitutional:      General: She is not in acute distress.    Appearance: She is well-developed. She is not diaphoretic.  HENT:     Head: Normocephalic and atraumatic.     Ears:     Comments: Beetle noted in the right ear canal. Eyes:     Pupils: Pupils are equal, round, and reactive to light.  Cardiovascular:     Rate and Rhythm: Normal rate and regular rhythm.     Heart sounds: No murmur heard.   No friction rub. No gallop.  Pulmonary:     Effort: Pulmonary effort is normal.     Breath sounds: No wheezing or rales.  Abdominal:     General: There is no distension.     Palpations: Abdomen is soft.     Tenderness: There is no abdominal tenderness.  Musculoskeletal:        General: No tenderness.     Cervical back: Normal range of motion and neck supple.  Skin:    General: Skin is warm and dry.  Neurological:     Mental Status: She is alert and oriented to person, place, and time.  Psychiatric:        Behavior: Behavior normal.    ED Results / Procedures / Treatments   Labs (all labs ordered are listed, but only abnormal results are displayed) Labs Reviewed - No data to display  EKG None  Radiology No results found.  Procedures Procedures   Medications Ordered in  ED Medications  lidocaine (PF) (XYLOCAINE) 1 % injection ( Intratympanic Given by Other 01/14/21 2125)    ED Course  I have reviewed the triage vital signs and the nursing notes.  Pertinent labs & imaging results that were available during my care of the patient were reviewed by me  and considered in my medical decision making (see chart for details).    MDM Rules/Calculators/A&P                           30 yo F with a chief complaint of an insect in the right ear.  This area was anesthetized with lidocaine and was able to stop the insect from moving.  It was then removed by nursing.  Discharge home.  9:28 PM:  I have discussed the diagnosis/risks/treatment options with the patient and family and believe the pt to be eligible for discharge home to follow-up with PCP. We also discussed returning to the ED immediately if new or worsening sx occur. We discussed the sx which are most concerning (e.g., sudden worsening pain, fever, inability to tolerate by mouth) that necessitate immediate return. Medications administered to the patient during their visit and any new prescriptions provided to the patient are listed below.  Medications given during this visit Medications  lidocaine (PF) (XYLOCAINE) 1 % injection ( Intratympanic Given by Other 01/14/21 2125)     The patient appears reasonably screen and/or stabilized for discharge and I doubt any other medical condition or other [facility] requiring further screening, evaluation, or treatment in the ED at this time prior to discharge.   Final Clinical Impression(s) / ED Diagnoses Final diagnoses:  Foreign body of right ear, initial encounter    Rx / DC Orders ED Discharge Orders     None        Melene Plan, DO 01/14/21 2128

## 2021-01-14 NOTE — Discharge Instructions (Signed)
Follow up with your doctor in the office.  

## 2021-01-14 NOTE — ED Triage Notes (Signed)
Pt c/o pain and something in right ear x 2 days. States she can feel something moving.

## 2021-01-14 NOTE — ED Notes (Signed)
Carolin Coy removed from pt's ear via irrigation.

## 2021-03-24 ENCOUNTER — Emergency Department (HOSPITAL_BASED_OUTPATIENT_CLINIC_OR_DEPARTMENT_OTHER)
Admission: EM | Admit: 2021-03-24 | Discharge: 2021-03-24 | Disposition: A | Discharge status: Home / Self Care | Payer: Medicaid Other | Attending: Emergency Medicine | Admitting: Emergency Medicine

## 2021-03-24 ENCOUNTER — Encounter (HOSPITAL_BASED_OUTPATIENT_CLINIC_OR_DEPARTMENT_OTHER): Payer: Self-pay | Admitting: *Deleted

## 2021-03-24 ENCOUNTER — Other Ambulatory Visit: Payer: Self-pay

## 2021-03-24 DIAGNOSIS — T162XXA Foreign body in left ear, initial encounter: Secondary | ICD-10-CM | POA: Insufficient documentation

## 2021-03-24 DIAGNOSIS — X58XXXA Exposure to other specified factors, initial encounter: Secondary | ICD-10-CM | POA: Insufficient documentation

## 2021-03-24 MED ORDER — LIDOCAINE-EPINEPHRINE-TETRACAINE (LET) TOPICAL GEL
3.0000 mL | Freq: Once | TOPICAL | Status: AC
  Administered 2021-03-24: 3 mL via TOPICAL
  Filled 2021-03-24: qty 3

## 2021-03-24 MED ORDER — PENTAFLUOROPROP-TETRAFLUOROETH EX AERO
INHALATION_SPRAY | CUTANEOUS | Status: DC | PRN
  Administered 2021-03-24: 30 via TOPICAL

## 2021-03-24 NOTE — Discharge Instructions (Signed)
Tylenol and Motrin as needed for pain.  Wash with soap and water, apply topical antibiotic ointment.  Follow-up with primary in a week for recheck if desired.  Return to ED if fever, purulent drainage, signs of infection.

## 2021-03-24 NOTE — ED Provider Notes (Signed)
[facility] Provider Note   CSN: [id number] Arrival date & time: 03/24/21  1736     History  Chief Complaint  Patient presents with   Foreign Body in Ear    Melissa Griffin is a 31 y.o. female.   Foreign Body in Ear   Patient is a 31 year old female with medical history notable for excellent intellectual disability presenting due to earring lodged in right ear.  Patient and guardian think its been there for about 2 days.  Patient is not having any pain or fevers, no difficulty swallowing.  Home Medications Prior to Admission medications   Medication Sig Start Date End Date Taking? Authorizing Provider  acetaminophen (TYLENOL) 500 MG tablet Take 2 tablets (1,000 mg total) by mouth every 8 (eight) hours as needed for mild pain, moderate pain or fever (cramping; 2nd line). 01/17/19   S[place]a Bing, MD  cholestyramine Lanetta Inch) 4 g packet Take 1 packet by mouth daily. 04/01/20   [provider]  clonazePAM (KLONOPIN) 1 MG tablet Take 1 mg by mouth at bedtime.    [provider]  cloNIDine (CATAPRES) 0.2 MG tablet Take 0.2 mg by mouth 2 (two) times daily.    [provider]  fluPHENAZine decanoate (PROLIXIN) 25 MG/ML injection Inject 125 mg into the muscle every 30 (thirty) days.    [provider]  FLUVOXAMINE MALEATE PO Take 75 mg by mouth.     [provider]  hydrOXYzine (ATARAX/VISTARIL) 25 MG tablet Take 1 tablet (25 mg total) by mouth 3 (three) times daily as needed. 12/12/18   Eustace Moore, MD  traZODone (DESYREL) 150 MG tablet Take by mouth at bedtime.    [provider]  Valbenazine Tosylate (INGREZZA) 40 MG CAPS Take 40 mg by mouth at bedtime.    [provider]  Vitamin D, Cholecalciferol, 10 MCG (400 UNIT) TABS Take 50 mcg by mouth in the morning and at bedtime.     Alver Fisher, RN  misoprostol (CYTOTEC) 200 MCG tablet Place two tabs ( ) in cheek for 30 minutes  and then swallow the night before your appointment 11/13/18 12/12/18   Bing, MD      Allergies    Patient has no known allergies.    Review of Systems   Review of Systems  Physical Exam Updated Vital Signs BP 118/73 (BP Location: Right Arm)    Pulse 82    Temp 98.4 F (36.9 C) (Oral)    Resp 20    Ht 5\' 8"  (1.727 m)    Wt 123.8 kg    SpO2 99%    BMI 41.50 kg/m  Physical Exam Vitals and nursing note reviewed. Exam conducted with a chaperone present.  Constitutional:      General: She is not in acute distress.    Appearance: Normal appearance.  HENT:     Head: Normocephalic and atraumatic.     Ears:     Comments: Foreign body embedded to the right posterior auricle.  Able to visualize the back of an earring. Eyes:     General: No scleral icterus.    Extraocular Movements: Extraocular movements intact.     Pupils: Pupils are equal, round, and reactive to light.  Skin:    Coloration: Skin is not jaundiced.  Neurological:     Mental Status: She is alert. Mental status is at baseline.     Coordination: Coordination normal.    ED Results / Procedures / Treatments   Labs (all  labs ordered are listed, but only abnormal results are displayed) Labs Reviewed - No data to display  EKG None  Radiology No results found.  Procedures .Foreign Body Removal  Date/Time: 03/24/2021 9:40 PM Performed by: Theron Arista, PA-C Authorized by: Theron Arista, PA-C  Consent: Verbal consent obtained. Risks and benefits: risks, benefits and alternatives were discussed Consent given by: power of attorney Patient understanding: patient states understanding of the procedure being performed Patient consent: the patient's understanding of the procedure matches consent given Procedure consent: procedure consent matches procedure scheduled Relevant documents: relevant documents present and verified Test results: test results available and properly labeled Site marked: the operative site  was marked Imaging studies: imaging studies available Required items: required blood products, implants, devices, and special equipment available Patient identity confirmed: hospital-assigned identification number Time out: Immediately prior to procedure a "time out" was called to verify the correct patient, procedure, equipment, support staff and site/side marked as required. Body area: ear Location details: right ear Anesthesia method: none.  Anesthesia: Local Anesthetic: LET (lido,epi,tetracaine)  Sedation: Patient sedated: no  Patient restrained: no Patient cooperative: yes Localization method: visualized Removal mechanism: forceps Complexity: simple 1 objects recovered. Post-procedure assessment: foreign body removed Patient tolerance: patient tolerated the procedure well with no immediate complications     Medications Ordered in ED Medications  pentafluoroprop-tetrafluoroeth (GEBAUERS) aerosol (30 application Topical Given 03/24/21 2036)  lidocaine-EPINEPHrine-tetracaine (LET) topical gel (3 mLs Topical Given 03/24/21 2036)    ED Course/ Medical Decision Making/ A&P                           Medical Decision Making Amount and/or Complexity of Data Reviewed Independent Historian: caregiver  Risk OTC drugs. Minor surgery with identified risk factors.   31 year old female presenting with foreign body to ear.  History is limited secondary to patient's intellectual disabilities.  Her guardian is in the room providing additional history.  I am able to visualize the backing of an earring.  Patient tolerated procedure well.  Successful backing of earring removed.  There is no front of the earring in the ear to remove.  Tetanus was updated 1 week ago with physical.  I considered antibiotics but given there is no cartilaginous involvement do not feel prophylactic antibiotics warranted.        Final Clinical Impression(s) / ED Diagnoses Final diagnoses:  Foreign body  of left ear, initial encounter    Rx / DC Orders ED Discharge Orders     None         Theron Arista, PA-C 03/24/21 2306    Rolan Bucco, MD 03/24/21 2324

## 2021-03-24 NOTE — ED Triage Notes (Signed)
Backing of her ear ring is embedded in the back of her right ear lobe.

## 2021-03-24 NOTE — ED Notes (Signed)
LET applied behind right earlobe.

## 2021-03-24 NOTE — ED Notes (Signed)
ED Provider at bedside. 

## 2021-06-21 ENCOUNTER — Other Ambulatory Visit: Payer: Self-pay

## 2021-06-21 ENCOUNTER — Emergency Department (HOSPITAL_BASED_OUTPATIENT_CLINIC_OR_DEPARTMENT_OTHER)
Admission: EM | Admit: 2021-06-21 | Discharge: 2021-06-21 | Disposition: A | Discharge status: Home / Self Care | Payer: Medicaid Other | Attending: Emergency Medicine | Admitting: Emergency Medicine

## 2021-06-21 ENCOUNTER — Encounter (HOSPITAL_BASED_OUTPATIENT_CLINIC_OR_DEPARTMENT_OTHER): Payer: Self-pay | Admitting: Emergency Medicine

## 2021-06-21 DIAGNOSIS — T162XXA Foreign body in left ear, initial encounter: Secondary | ICD-10-CM | POA: Insufficient documentation

## 2021-06-21 DIAGNOSIS — X58XXXA Exposure to other specified factors, initial encounter: Secondary | ICD-10-CM | POA: Diagnosis not present

## 2021-06-21 NOTE — ED Notes (Signed)
Insect was removed from LT ear by EDP

## 2021-06-21 NOTE — ED Provider Notes (Signed)
[facility] Provider Note   CSN: [id number] Arrival date & time: 06/21/21  1827     History  Chief Complaint  Patient presents with   Foreign Body in Ear    Melissa Griffin is a 31 y.o. female.  31 year old female brought in by guardian for complaint of bug in the left ear.  Patient states that she was laying down with a bug climbed in her ear.  No longer feels the bug moving and suspects it is dead.  No other complaints or concerns.      Home Medications Prior to Admission medications   Medication Sig Start Date End Date Taking? Authorizing Provider  acetaminophen (TYLENOL) 500 MG tablet Take 2 tablets (1,000 mg total) by mouth every 8 (eight) hours as needed for mild pain, moderate pain or fever (cramping; 2nd line). 01/17/19   Pinole Bing, MD  cholestyramine Lanetta Inch) 4 g packet Take 1 packet by mouth daily. 04/01/20   [provider]  clonazePAM (KLONOPIN) 1 MG tablet Take 1 mg by mouth at bedtime.    [provider]  cloNIDine (CATAPRES) 0.2 MG tablet Take 0.2 mg by mouth 2 (two) times daily.    [provider]  fluPHENAZine decanoate (PROLIXIN) 25 MG/ML injection Inject 125 mg into the muscle every 30 (thirty) days.    [provider]  FLUVOXAMINE MALEATE PO Take 75 mg by mouth.     [provider]  hydrOXYzine (ATARAX/VISTARIL) 25 MG tablet Take 1 tablet (25 mg total) by mouth 3 (three) times daily as needed. 12/12/18   Eustace Moore, MD  traZODone (DESYREL) 150 MG tablet Take by mouth at bedtime.    [provider]  Valbenazine Tosylate (INGREZZA) 40 MG CAPS Take 40 mg by mouth at bedtime.    [provider]  Vitamin D, Cholecalciferol, 10 MCG (400 UNIT) TABS Take 50 mcg by mouth in the morning and at bedtime.     Alver Fisher, RN  misoprostol (CYTOTEC) 200 MCG tablet Place two tabs ( ) in cheek for 30 minutes and then swallow the night before your appointment  11/13/18 12/12/18  [place] Bing, MD      Allergies    Patient has no known allergies.    Review of Systems   Review of Systems Negative except as per HPI Physical Exam Updated Vital Signs BP 108/81   Pulse (!) 121   Temp 98.6 F (37 C) (Oral)   Resp 16   SpO2 100%  Physical Exam Vitals and nursing note reviewed.  Constitutional:      General: She is not in acute distress.    Appearance: She is well-developed. She is not diaphoretic.  HENT:     Head: Normocephalic and atraumatic.     Ears:     Comments: Live bug in left ear canal Pulmonary:     Effort: Pulmonary effort is normal.  Skin:    General: Skin is warm and dry.     Findings: No erythema or rash.  Neurological:     Mental Status: She is alert and oriented to person, place, and time.  Psychiatric:        Behavior: Behavior normal.    ED Results / Procedures / Treatments   Labs (all labs ordered are listed, but only abnormal results are displayed) Labs Reviewed - No data to display  EKG None  Radiology No results found.  Procedures .Foreign Body Removal  Date/Time: 06/21/2021 6:54 PM Performed by: Army Melia  A, PA-C Authorized by: Jeannie Fend, PA-C   .Foreign Body Removal  Date/Time: 06/21/2021 6:54 PM Performed by: Jeannie Fend, PA-C Authorized by: Jeannie Fend, PA-C  Consent: Verbal consent obtained. Consent given by: patient and guardian Patient identity confirmed: verbally with patient Body area: ear Location details: left ear  Sedation: Patient sedated: no  Patient restrained: no Patient cooperative: yes Localization method: ENT speculum Removal mechanism: alligator forceps Complexity: simple 1 objects recovered. Objects recovered: Live bug Post-procedure assessment: foreign body removed Patient tolerance: patient tolerated the procedure well with no immediate complications Comments: TM intact     Medications Ordered in ED Medications - No data to  display  ED Course/ Medical Decision Making/ A&P                           Medical Decision Making  31 year old female with black bug in left ear, removed intact without difficulty.  TM intact.  Recheck with PCP as needed.  Guardian present who verifies story, no other needs today.        Final Clinical Impression(s) / ED Diagnoses Final diagnoses:  Foreign body of left ear, initial encounter    Rx / DC Orders ED Discharge Orders     None         Jeannie Fend, PA-C 06/21/21 Bebe Liter, MD 06/22/21 [id number]

## 2021-06-21 NOTE — ED Triage Notes (Signed)
Pt c/o bug to LT ear

## 2022-02-20 ENCOUNTER — Emergency Department (HOSPITAL_BASED_OUTPATIENT_CLINIC_OR_DEPARTMENT_OTHER): Payer: Medicaid Other

## 2022-02-20 ENCOUNTER — Emergency Department (HOSPITAL_BASED_OUTPATIENT_CLINIC_OR_DEPARTMENT_OTHER)
Admission: EM | Admit: 2022-02-20 | Discharge: 2022-02-21 | Disposition: A | Discharge status: Home / Self Care | Payer: Medicaid Other | Attending: Emergency Medicine | Admitting: Emergency Medicine

## 2022-02-20 ENCOUNTER — Encounter (HOSPITAL_BASED_OUTPATIENT_CLINIC_OR_DEPARTMENT_OTHER): Payer: Self-pay | Admitting: Emergency Medicine

## 2022-02-20 ENCOUNTER — Other Ambulatory Visit: Payer: Self-pay

## 2022-02-20 DIAGNOSIS — R079 Chest pain, unspecified: Secondary | ICD-10-CM | POA: Insufficient documentation

## 2022-02-20 DIAGNOSIS — R55 Syncope and collapse: Secondary | ICD-10-CM | POA: Diagnosis not present

## 2022-02-20 LAB — CBC
HCT: 36.4 % (ref 36.0–46.0)
Hemoglobin: 11.5 g/dL — ABNORMAL LOW (ref 12.0–15.0)
MCH: 27.1 pg (ref 26.0–34.0)
MCHC: 31.6 g/dL (ref 30.0–36.0)
MCV: 85.6 fL (ref 80.0–100.0)
Platelets: 375 10*3/uL (ref 150–400)
RBC: 4.25 MIL/uL (ref 3.87–5.11)
RDW: 13.7 % (ref 11.5–15.5)
WBC: 11.7 10*3/uL — ABNORMAL HIGH (ref 4.0–10.5)
nRBC: 0 % (ref 0.0–0.2)

## 2022-02-20 LAB — BASIC METABOLIC PANEL
Anion gap: 7 (ref 5–15)
BUN: 11 mg/dL (ref 6–20)
CO2: 25 mmol/L (ref 22–32)
Calcium: 8.5 mg/dL — ABNORMAL LOW (ref 8.9–10.3)
Chloride: 102 mmol/L (ref 98–111)
Creatinine, Ser: 1.01 mg/dL — ABNORMAL HIGH (ref 0.44–1.00)
GFR, Estimated: >60 mL/min (ref >60)
Glucose, Bld: 104 mg/dL — ABNORMAL HIGH (ref 70–99)
Potassium: 3.5 mmol/L (ref 3.5–5.1)
Sodium: 134 mmol/L — ABNORMAL LOW (ref 135–145)

## 2022-02-20 LAB — TROPONIN I (HIGH SENSITIVITY): Troponin I (High Sensitivity): <2 ng/L (ref <18)

## 2022-02-20 MED ORDER — SODIUM CHLORIDE 0.9 % IV BOLUS
1000.0000 mL | Freq: Once | INTRAVENOUS | Status: AC
  Administered 2022-02-20: 1000 mL via INTRAVENOUS

## 2022-02-20 NOTE — ED Triage Notes (Signed)
Pt c/o middle upper CP (indicates near clavicle) x today; reports she felt dizzy and like she was near syncopal earlier
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
## 2022-02-20 NOTE — ED Notes (Signed)
Pt to radiology.

## 2022-02-20 NOTE — Discharge Instructions (Addendum)
You were seen today for chest pain. Your workup was reassuring for no signs of acute coronary syndrome.  There is no pneumonia on your chest x-ray.  Please follow-up with your primary care provider for further evaluation as needed.  You may take over-the-counter medication such as Tylenol or ibuprofen for pain control.  If you develop any life-threatening symptoms please return to the emergency department.

## 2022-02-21 LAB — TROPONIN I (HIGH SENSITIVITY): Troponin I (High Sensitivity): 2 ng/L (ref <18)

## 2022-03-16 IMAGING — CR DG ABDOMEN ACUTE W/ 1V CHEST
1 series · 1 of 1 positions shown · non-contrast
Comparison: Chest 02/06/2017

CLINICAL DATA: Nausea and vomiting. Abdominal soreness. Sore
throat.

EXAM:
DG ABDOMEN ACUTE W/ 1V CHEST

[w chest pa]
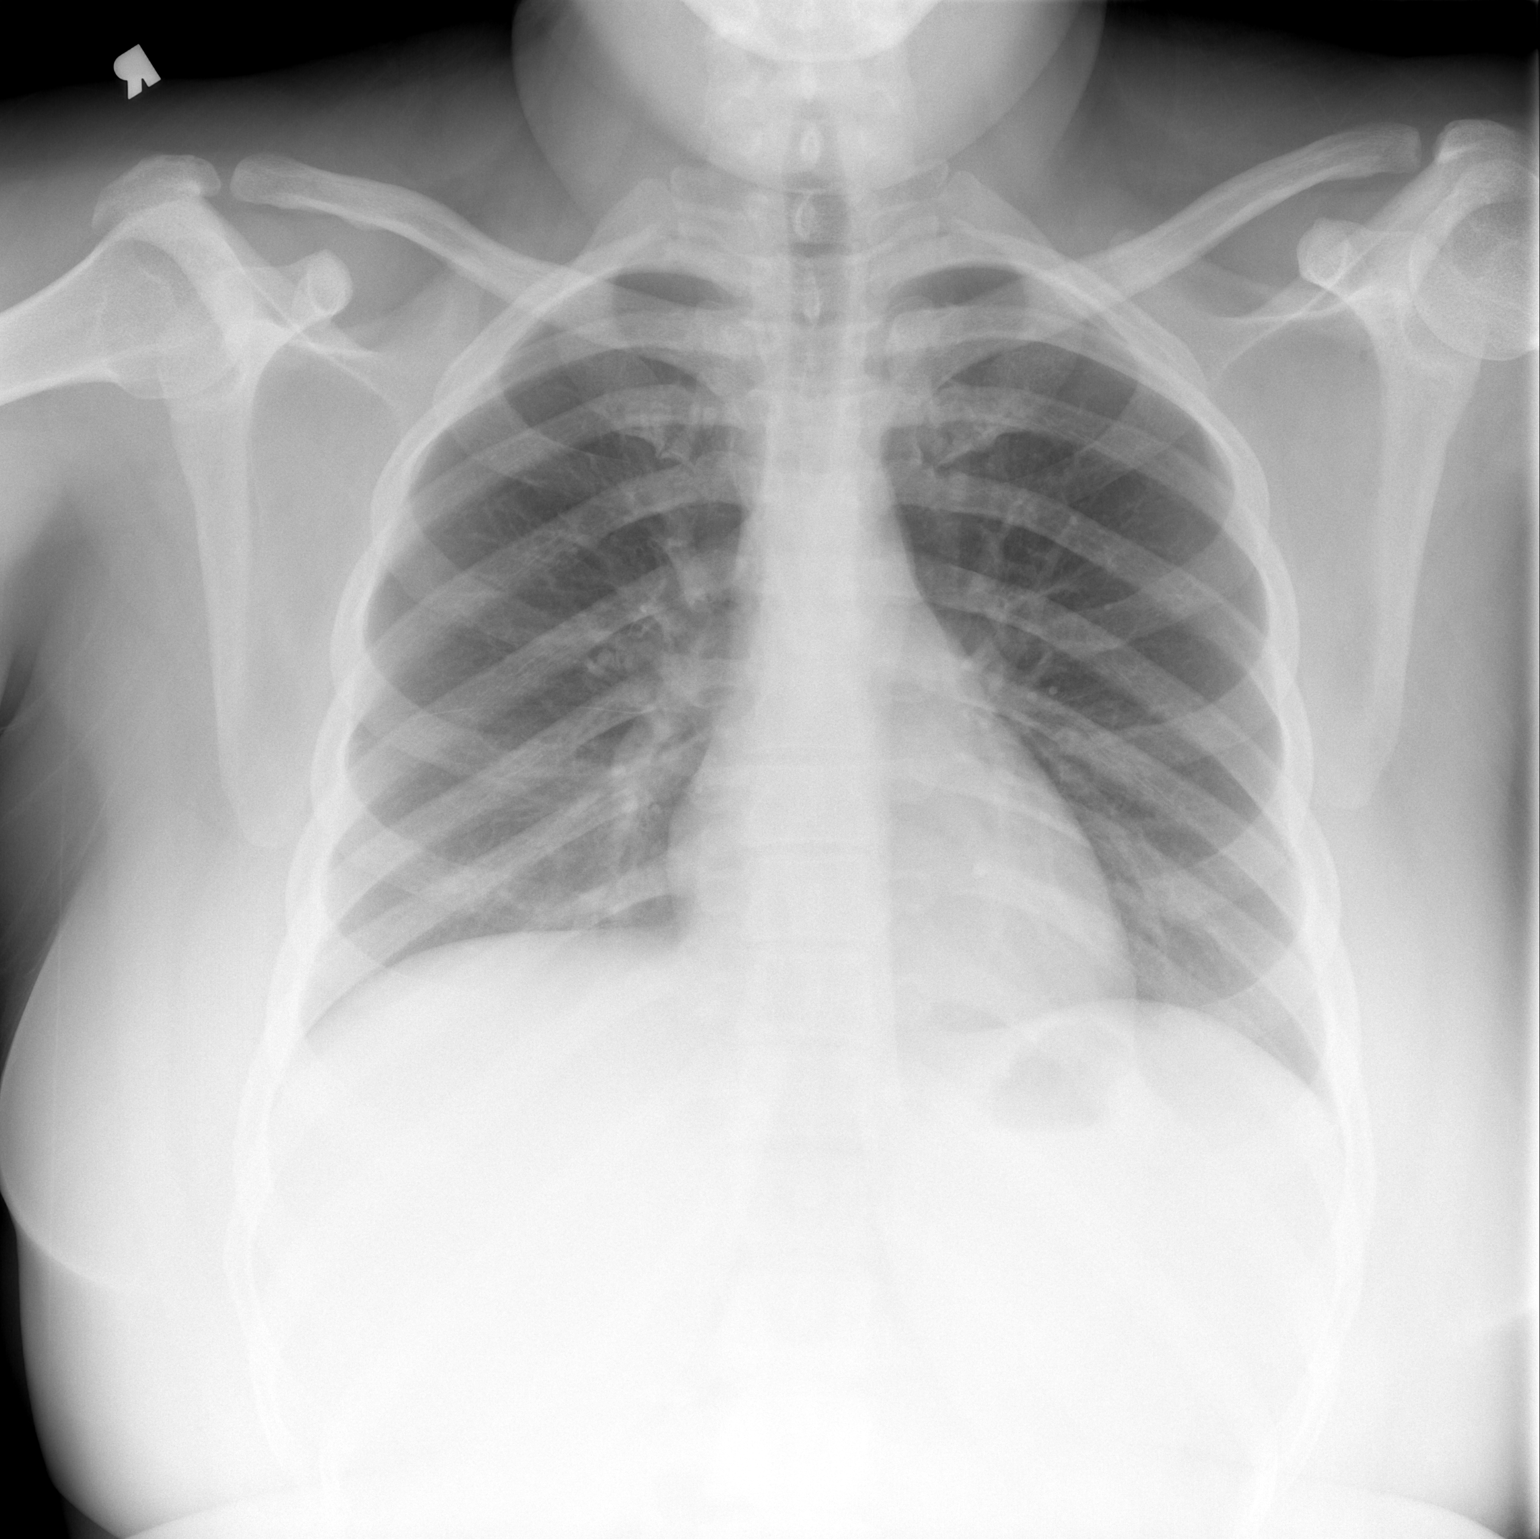

[1 of 1 positions shown; findings below may reference images not displayed]

FINDINGS: Normal heart size and pulmonary vascularity. Lungs are clear. No
pleural effusions. No pneumothorax. Mediastinal contours appear
intact.

Scattered gas and stool in the colon. No small or large bowel
distention. No free intra-abdominal air. No abnormal air-fluid
levels. No radiopaque stones. Surgical clips in the right upper
quadrant. Calcified phleboliths in the pelvis. Visualized bones and
soft tissue contours appear intact.
IMPRESSION: No evidence of active pulmonary disease. Nonobstructive bowel gas
pattern.

## 2022-06-06 ENCOUNTER — Emergency Department (HOSPITAL_BASED_OUTPATIENT_CLINIC_OR_DEPARTMENT_OTHER)
Admission: EM | Admit: 2022-06-06 | Discharge: 2022-06-06 | Disposition: A | Discharge status: Home / Self Care | Payer: Medicaid Other | Attending: Emergency Medicine | Admitting: Emergency Medicine

## 2022-06-06 ENCOUNTER — Encounter (HOSPITAL_BASED_OUTPATIENT_CLINIC_OR_DEPARTMENT_OTHER): Payer: Self-pay | Admitting: Emergency Medicine

## 2022-06-06 ENCOUNTER — Other Ambulatory Visit: Payer: Self-pay

## 2022-06-06 DIAGNOSIS — T162XXA Foreign body in left ear, initial encounter: Secondary | ICD-10-CM | POA: Diagnosis present

## 2022-06-06 DIAGNOSIS — X58XXXA Exposure to other specified factors, initial encounter: Secondary | ICD-10-CM | POA: Diagnosis not present

## 2022-06-06 NOTE — ED Provider Notes (Signed)
[place] EMERGENCY DEPARTMENT AT [facility] Provider Note   CSN: [id number] Arrival date & time: 06/06/22  1736     History  Chief Complaint  Patient presents with   Foreign Body in Ear    Melissa Griffin is a 32 y.o. female presents to the ED with concern for an insect in her ear.  She states she was sleeping when she felt something crawl into her ear.  She has not put anything into the ear herself and has not attempted to remove the insect from her ear.         Home Medications Prior to Admission medications   Medication Sig Start Date End Date Taking? Authorizing Provider  acetaminophen (TYLENOL) 500 MG tablet Take 2 tablets (1,000 mg total) by mouth every 8 (eight) hours as needed for mild pain, moderate pain or fever (cramping; 2nd line). 01/17/19   [place] Bing, MD  cholestyramine Lanetta Inch) 4 g packet Take 1 packet by mouth daily. 04/01/20   [provider]  clonazePAM (KLONOPIN) 1 MG tablet Take 1 mg by mouth at bedtime.    [provider]  cloNIDine (CATAPRES) 0.2 MG tablet Take 0.2 mg by mouth 2 (two) times daily.    [provider]  fluPHENAZine decanoate (PROLIXIN) 25 MG/ML injection Inject 125 mg into the muscle every 30 (thirty) days.    [provider]  FLUVOXAMINE MALEATE PO Take 75 mg by mouth.     [provider]  hydrOXYzine (ATARAX/VISTARIL) 25 MG tablet Take 1 tablet (25 mg total) by mouth 3 (three) times daily as needed. 12/12/18   Eustace Moore, MD  traZODone (DESYREL) 150 MG tablet Take by mouth at bedtime.    [provider]  Valbenazine Tosylate (INGREZZA) 40 MG CAPS Take 40 mg by mouth at bedtime.    [provider]  Vitamin D, Cholecalciferol, 10 MCG (400 UNIT) TABS Take 50 mcg by mouth in the morning and at bedtime.     Alver Fisher, RN  misoprostol (CYTOTEC) 200 MCG tablet Place two tabs ( ) in cheek for 30 minutes and then swallow the night before your  appointment 11/13/18 12/12/18   Bing, MD      Allergies    Patient has no known allergies.    Review of Systems   Review of Systems  HENT:  Negative for ear discharge and ear pain.        Foreign body in left ear    Physical Exam Updated Vital Signs BP (!) 135/54 (BP Location: Right Arm)   Pulse (!) 55   Temp 98.6 F (37 C) (Oral)   Resp 16   Ht 5\' 8"  (1.727 m)   Wt 123.8 kg   SpO2 96%   BMI 41.50 kg/m  Physical Exam Vitals and nursing note reviewed.  Constitutional:      General: She is not in acute distress.    Appearance: Normal appearance. She is not ill-appearing or diaphoretic.  HENT:     Left Ear: Tympanic membrane and ear canal normal.     Ears:     Comments: There is there is an insect sitting in the left ear canal against the TM. Cardiovascular:     Rate and Rhythm: Normal rate and regular rhythm.  Pulmonary:     Effort: Pulmonary effort is normal.  Neurological:     Mental Status: She is alert. Mental status is at baseline.  Psychiatric:        Mood and  Affect: Mood normal.        Behavior: Behavior normal.     ED Results / Procedures / Treatments   Labs (all labs ordered are listed, but only abnormal results are displayed) Labs Reviewed - No data to display  EKG None  Radiology No results found.  Procedures .Foreign Body Removal  Date/Time: 06/06/2022 11:59 PM  Performed by: Lenard Simmer, PA-C Authorized by: Lenard Simmer, PA-C  Consent: Verbal consent obtained. Risks and benefits: risks, benefits and alternatives were discussed Consent given by: patient and guardian Patient understanding: patient states understanding of the procedure being performed Patient identity confirmed: arm band and verbally with patient Body area: ear Location details: left ear  Sedation: Patient sedated: no  Patient restrained: no Patient cooperative: yes Localization method: visualized Removal mechanism: forceps Complexity: simple 1  objects recovered. Objects recovered: insect Post-procedure assessment: foreign body removed Patient tolerance: patient tolerated the procedure well with no immediate complications      Medications Ordered in ED Medications - No data to display  ED Course/ Medical Decision Making/ A&P                             Medical Decision Making  Patient brought to the ED for an insect in her left ear.  Patient states she was sleeping when she felt something crawl into her ear.  She has not attempted removal herself.  Exam significant for an insect sitting in the left ear canal against the TM.  Insect appears to be alive.  Hydrogen peroxide was instilled into the ear and left for 30 minutes while patient was laying on her right side.  Insect still in ear after hydrogen peroxide.  Insect was successfully removed with tweezers.  Left EAC inspected following foreign body removal, no bleeding, erythema, or damage to the TM.  Insect was successfully removed with nothing left behind in the ear canal.  Recommended patient's room be inspected for insects so this does not occur again.  Patient's father verbalized his understanding.        Final Clinical Impression(s) / ED Diagnoses Final diagnoses:  Foreign body of left ear, initial encounter    Rx / DC Orders ED Discharge Orders     None         Lenard Simmer, PA-C 06/07/22 0001    Glyn Ade, MD 06/07/22 1537

## 2022-06-06 NOTE — Discharge Instructions (Signed)
Thank you for letting me be a part of your care today.   You presented to the ED with a bug in your ear which was successfully removed.  Please be sure to keep your ear clean and avoid inserting anything into the ear canal.

## 2022-06-06 NOTE — ED Triage Notes (Signed)
Pt reports bug in LT ear x 30 min

## 2022-06-15 ENCOUNTER — Emergency Department (HOSPITAL_BASED_OUTPATIENT_CLINIC_OR_DEPARTMENT_OTHER): Payer: Medicaid Other

## 2022-06-15 ENCOUNTER — Other Ambulatory Visit: Payer: Self-pay

## 2022-06-15 ENCOUNTER — Encounter (HOSPITAL_BASED_OUTPATIENT_CLINIC_OR_DEPARTMENT_OTHER): Payer: Self-pay

## 2022-06-15 ENCOUNTER — Emergency Department (HOSPITAL_BASED_OUTPATIENT_CLINIC_OR_DEPARTMENT_OTHER)
Admission: EM | Admit: 2022-06-15 | Discharge: 2022-06-16 | Disposition: A | Discharge status: Home / Self Care | Payer: Medicaid Other | Attending: Emergency Medicine | Admitting: Emergency Medicine

## 2022-06-15 DIAGNOSIS — I493 Ventricular premature depolarization: Secondary | ICD-10-CM

## 2022-06-15 DIAGNOSIS — T162XXA Foreign body in left ear, initial encounter: Secondary | ICD-10-CM | POA: Diagnosis not present

## 2022-06-15 DIAGNOSIS — R0600 Dyspnea, unspecified: Secondary | ICD-10-CM | POA: Insufficient documentation

## 2022-06-15 DIAGNOSIS — W448XXA Other foreign body entering into or through a natural orifice, initial encounter: Secondary | ICD-10-CM | POA: Insufficient documentation

## 2022-06-15 DIAGNOSIS — E876 Hypokalemia: Secondary | ICD-10-CM | POA: Diagnosis not present

## 2022-06-15 DIAGNOSIS — R0789 Other chest pain: Secondary | ICD-10-CM

## 2022-06-15 LAB — CBC
HCT: 36.5 % (ref 36.0–46.0)
Hemoglobin: 11.8 g/dL — ABNORMAL LOW (ref 12.0–15.0)
MCH: 27.3 pg (ref 26.0–34.0)
MCHC: 32.3 g/dL (ref 30.0–36.0)
MCV: 84.3 fL (ref 80.0–100.0)
Platelets: 373 10*3/uL (ref 150–400)
RBC: 4.33 MIL/uL (ref 3.87–5.11)
RDW: 13.1 % (ref 11.5–15.5)
WBC: 14 10*3/uL — ABNORMAL HIGH (ref 4.0–10.5)
nRBC: 0 % (ref 0.0–0.2)

## 2022-06-15 LAB — BASIC METABOLIC PANEL
Anion gap: 8 (ref 5–15)
BUN: 10 mg/dL (ref 6–20)
CO2: 22 mmol/L (ref 22–32)
Calcium: 8.9 mg/dL (ref 8.9–10.3)
Chloride: 107 mmol/L (ref 98–111)
Creatinine, Ser: 1.11 mg/dL — ABNORMAL HIGH (ref 0.44–1.00)
GFR, Estimated: >60 mL/min (ref >60)
Glucose, Bld: 122 mg/dL — ABNORMAL HIGH (ref 70–99)
Potassium: 3.4 mmol/L — ABNORMAL LOW (ref 3.5–5.1)
Sodium: 137 mmol/L (ref 135–145)

## 2022-06-15 LAB — TROPONIN I (HIGH SENSITIVITY): Troponin I (High Sensitivity): <2 ng/L (ref <18)

## 2022-06-15 MED ORDER — LIDOCAINE HCL (PF) 1 % IJ SOLN
INTRAMUSCULAR | Status: AC
  Administered 2022-06-15: 1 mL
  Filled 2022-06-15: qty 5

## 2022-06-15 NOTE — ED Notes (Signed)
Removed roach from pt's LT ear.

## 2022-06-15 NOTE — ED Triage Notes (Signed)
Pt reports she has a bug in her LT ear. Mother states that she is really here for SOB. Took her BP and it was somewhat elevated (unable to provide the number). Pt now reporting CP. Unable to elaborate due to continuing to states "I have a bug in my ear".

## 2022-06-16 LAB — PREGNANCY, URINE: Preg Test, Ur: NEGATIVE

## 2022-06-16 LAB — TROPONIN I (HIGH SENSITIVITY): Troponin I (High Sensitivity): <2 ng/L (ref <18)

## 2022-06-16 MED ORDER — DOXYCYCLINE HYCLATE 100 MG PO CAPS: 100.0000 mg | ORAL_CAPSULE | Freq: Two times a day (BID) | ORAL | 0 refills | Status: DC

## 2022-06-16 MED ORDER — CIPROFLOXACIN-DEXAMETHASONE 0.3-0.1 % OT SUSP
4.0000 [drp] | Freq: Once | OTIC | Status: AC
  Administered 2022-06-16: 4 [drp] via OTIC
  Filled 2022-06-16: qty 7.5

## 2022-06-16 MED ORDER — DOXYCYCLINE HYCLATE 100 MG PO CAPS: 100.0000 mg | ORAL_CAPSULE | Freq: Two times a day (BID) | ORAL | 0 refills | Status: AC

## 2022-06-16 NOTE — Discharge Instructions (Addendum)
You were seen in the emergency department today for chest pain and shortness of breath. You workup did not reveal a definite cause of your symptoms but was generally reassuring.   Take Tylenol and your reflux medicine for pain.  Take the doxycycline for bronchitis.  Use 4 drops of the eardrops in the left ear 2 times a day for the next 5 days.  Make an appointment with the ear nose and throat doctor for removal of the leg of the cockroach in your left ear.  Return to the emergency department immediately if you develop recurrent, severe chest pain, shortness of breath, fainting spells, sudden sweatiness, or any other concerning symptoms.   Please also make an appointment to follow up with your primary care doctor or cardiologist within one week to assure improvement or resolution in symptoms. Further testing may be necessary, so it is extremely important to keep your follow-up appointment with your primary doctor.   You have been referred to cardiology for heart monitor for premature contractions noted on your EKG and cardiac monitoring.

## 2022-06-16 NOTE — ED Provider Notes (Signed)
EMERGENCY DEPARTMENT AT [facility] Provider Note   CSN: [id number] Arrival date & time: 06/15/22  2218     History  Chief Complaint  Patient presents with   Foreign Body in Ear   Chest Pain    Melissa Griffin is a 32 y.o. female.  With PMH of obesity, GERD, schizoaffective disorder, ID who presents with complaints of bug in left ear as well as shortness of breath.  Patient's history provided by both patient and patient's family member at bedside.  She came in mainly because she had a cockroach stuck in her left ear which was irritating her and making her anxious.  However patient's family member also notes concern for shortness of breath as she was out in the waiting room she seemed to be having increased respiratory rate however on exam she is denying any shortness of breath or active chest pain.  She does have history of GERD and a lot of burning pain that is substernal and nonradiating.  She also complains of of recent new nonproductive cough and low-grade fevers at home.  She has had no leg pain or swelling.  She is not on OCPs or estrogen.  No history of PE or DVT.  No nausea, no vomiting, no diaphoresis.  No recent travel or surgery.    Patient poor historian due to ID.   Foreign Body in Ear Associated symptoms include chest pain.  Chest Pain      Home Medications Prior to Admission medications   Medication Sig Start Date End Date Taking? Authorizing Provider  acetaminophen (TYLENOL) 500 MG tablet Take 2 tablets (1,000 mg total) by mouth every 8 (eight) hours as needed for mild pain, moderate pain or fever (cramping; 2nd line). 01/17/19   [place] Bing, MD  cholestyramine Lanetta Inch) 4 g packet Take 1 packet by mouth daily. 04/01/20   [provider]  clonazePAM (KLONOPIN) 1 MG tablet Take 1 mg by mouth at bedtime.    [provider]  cloNIDine (CATAPRES) 0.2 MG tablet Take 0.2 mg by mouth 2 (two) times daily.    [provider]  doxycycline (VIBRAMYCIN) 100 MG capsule Take 1 capsule (100 mg total) by mouth 2 (two) times daily for 7 days. 06/16/22 06/23/22  Mardene Sayer, MD  fluPHENAZine decanoate (PROLIXIN) 25 MG/ML injection Inject 125 mg into the muscle every 30 (thirty) days.    [provider]  FLUVOXAMINE MALEATE PO Take 75 mg by mouth.     [provider]  hydrOXYzine (ATARAX/VISTARIL) 25 MG tablet Take 1 tablet (25 mg total) by mouth 3 (three) times daily as needed. 12/12/18   Eustace Moore, MD  traZODone (DESYREL) 150 MG tablet Take by mouth at bedtime.    [provider]  Valbenazine Tosylate (INGREZZA) 40 MG CAPS Take 40 mg by mouth at bedtime.    [provider]  Vitamin D, Cholecalciferol, 10 MCG (400 UNIT) TABS Take 50 mcg by mouth in the morning and at bedtime.     Alver Fisher, RN  misoprostol (CYTOTEC) 200 MCG tablet Place two tabs ( ) in cheek for 30 minutes and then swallow the night before your appointment 11/13/18 12/12/18  [place] Bing, MD      Allergies    Patient has no known allergies.    Review of Systems   Review of Systems  Cardiovascular:  Positive for chest pain.    Physical Exam Updated Vital Signs BP 115/73   Pulse (!) 42  Temp 99.1 F (37.3 C) (Oral)   Resp (!) 25   Ht 5\' 8"  (1.727 m)   Wt 127.9 kg   SpO2 100%   BMI 42.88 kg/m  Physical Exam Constitutional: Alert and oriented.  Intermittently anxious on exam but otherwise no acute distress and nontoxic Eyes: Conjunctivae are normal. ENT      Head: Normocephalic and atraumatic.      Ears: Right TM nonerythematous nonbulging.  Left TM visualized leg remaining from cockroach and mild erythema and irritation of ear canal.  TM is nonerythematous and nonbulging.      Neck: No stridor. Cardiovascular: S1, S2,  Normal and symmetric distal pulses are present in all extremities.Warm and well perfused.  No reproducible chest pain or deformity. Respiratory: Normal  respiratory effort. Breath sounds are normal.  O2 sat 100 on RA Gastrointestinal: Soft and nontender.  Musculoskeletal: Normal range of motion in all extremities.      Right lower leg: No tenderness or edema.      Left lower leg: No tenderness or edema. Neurologic: Normal speech and language.  Moving all 4 extremities equally.  Sensation grossly intact.  Steady gait.  No gross focal neurologic deficits are appreciated. Skin: Skin is warm, dry and intact. No rash noted. Psychiatric: Mood and affect are normal. Speech and behavior are normal.  ED Results / Procedures / Treatments   Labs (all labs ordered are listed, but only abnormal results are displayed) Labs Reviewed  BASIC METABOLIC PANEL - Abnormal; Notable for the following components:      Result Value   Potassium 3.4 (*)    Glucose, Bld 122 (*)    Creatinine, Ser 1.11 (*)    All other components within normal limits  CBC - Abnormal; Notable for the following components:   WBC 14.0 (*)    Hemoglobin 11.8 (*)    All other components within normal limits  PREGNANCY, URINE  TROPONIN I (HIGH SENSITIVITY)  TROPONIN I (HIGH SENSITIVITY)    EKG None  Radiology DG Chest 2 View  Result Date: 06/15/2022 CLINICAL DATA:  Chest pain EXAM: CHEST - 2 VIEW COMPARISON:  Chest x-ray 02/20/2022 FINDINGS: The heart size and mediastinal contours are within normal limits. Both lungs are clear. The visualized skeletal structures are unremarkable. IMPRESSION: No active cardiopulmonary disease. Electronically Signed   By: Darliss Cheney M.D.   On: 06/15/2022 23:07    Procedures Procedures    Medications Ordered in ED Medications  lidocaine (PF) (XYLOCAINE) 1 % injection (1 mL  Given 06/15/22 2245)  ciprofloxacin-dexamethasone (CIPRODEX) 0.3-0.1 % OTIC (EAR) suspension 4 drop (4 drops Left EAR Given 06/16/22 1610)    ED Course/ Medical Decision Making/ A&P                             Medical Decision Making Melissa Griffin is a 32 y.o.  female.  With PMH of obesity, GERD, schizoaffective disorder, ID who presents with complaints of bug in left ear as well as shortness of breath.  Patient had cockroach/foreign body in left ear which was removed by nursing staff after placing lidocaine in the left ear however there was remains of a leg that was unable to obtain with alligator forceps.  She was unable to tolerate further procedures so started on Ciprodex drops and referred to ENT for outpatient removal of cockroach leg.  Regarding patient's chest pain, suspect this is multifactorial.  She is a poor historian due to  history of ID.  She has episodes of increased work of breathing due to anxiety when trying to manipulate ear or when having cockroach in ear.  She is PERC negative with no hypoxia and no signs of DVT on exam, I am not concerned for PE.  She had a chest x-ray obtained which I personally reviewed no evidence of pneumonia or pneumothorax or pulmonary edema.  I am not concerned for ACS, heart score is 1, EKG with PVCs unchanged from prior with nonspecific T wave changes unchanged from prior and undetected troponins.  I suspect her pain is a mixture of known GERD as well as mild pleurisy from possible bronchitis with new cough and low-grade fever.  She also had associated leukocytosis 14.  Creatinine was slightly elevated 1.11.  Will start patient on doxycycline for a possible bronchitis.  Advised continue Tylenol and reflux medicine for pain control.  Referred to cardiology for outpatient follow-up for cardiac monitoring due to PVCs and shortness of breath.  Amount and/or Complexity of Data Reviewed Labs: ordered. Radiology: ordered.  Risk Prescription drug management.     Final Clinical Impression(s) / ED Diagnoses Final diagnoses:  Atypical chest pain  Dyspnea, unspecified type  Foreign body of left ear, initial encounter  PVC (premature ventricular contraction)    Rx / DC Orders ED Discharge Orders          Ordered     Ambulatory referral to Cardiology       Comments: If you have not heard from the Cardiology office within the next 72 hours please call [phone].   06/16/22 0211    doxycycline (VIBRAMYCIN) 100 MG capsule  2 times daily,   Status:  Discontinued        06/16/22 0212    doxycycline (VIBRAMYCIN) 100 MG capsule  2 times daily,   Status:  Discontinued        06/16/22 0213    doxycycline (VIBRAMYCIN) 100 MG capsule  2 times daily        06/16/22 0253              Mardene Sayer, MD 06/16/22 [phone]

## 2022-07-05 ENCOUNTER — Encounter (HOSPITAL_BASED_OUTPATIENT_CLINIC_OR_DEPARTMENT_OTHER): Payer: Self-pay

## 2022-07-05 ENCOUNTER — Other Ambulatory Visit: Payer: Self-pay

## 2022-07-05 ENCOUNTER — Emergency Department (HOSPITAL_BASED_OUTPATIENT_CLINIC_OR_DEPARTMENT_OTHER)
Admission: EM | Admit: 2022-07-05 | Discharge: 2022-07-05 | Disposition: A | Discharge status: Home / Self Care | Payer: Medicaid Other | Attending: Emergency Medicine | Admitting: Emergency Medicine

## 2022-07-05 DIAGNOSIS — W44F4XA Insect entering into or through a natural orifice, initial encounter: Secondary | ICD-10-CM | POA: Insufficient documentation

## 2022-07-05 DIAGNOSIS — T161XXA Foreign body in right ear, initial encounter: Secondary | ICD-10-CM | POA: Diagnosis present

## 2022-07-05 MED ORDER — LIDOCAINE VISCOUS HCL 2 % SOLUTION FOR USE IN EAR (ED/BUG EXTRACTION)
15.0000 mL | Freq: Once | OROMUCOSAL | Status: AC
  Administered 2022-07-05: 15 mL via OTIC
  Filled 2022-07-05 (×2): qty 15

## 2022-07-05 NOTE — ED Triage Notes (Signed)
Pt states she has a bug in her rt. ear

## 2022-07-05 NOTE — Discharge Instructions (Addendum)
Alternate between Tylenol and Ibuprofen every 4 hours as needed for ear pain.   Return to ED if: You notice blood or fluid coming from your ear. You have sudden hearing loss.

## 2022-07-05 NOTE — ED Provider Notes (Signed)
[place] EMERGENCY DEPARTMENT AT [facility] Provider Note   CSN: [id number] Arrival date & time: 07/05/22  1743     History  Chief Complaint  Patient presents with   Foreign Body in Ear    Melissa Griffin is a 32 y.o. female who presents to the ED today for possible foreign body in right ear. Patient reports that she was laying in bed about 20 minutes ago when she felt something crawl into her right ear. She says that she felt it moving around in there.  No other complaints or concerns at this time.    Home Medications Prior to Admission medications   Medication Sig Start Date End Date Taking? Authorizing Provider  acetaminophen (TYLENOL) 500 MG tablet Take 2 tablets (1,000 mg total) by mouth every 8 (eight) hours as needed for mild pain, moderate pain or fever (cramping; 2nd line). 01/17/19   [place] Bing, MD  cholestyramine Lanetta Inch) 4 g packet Take 1 packet by mouth daily. 04/01/20   [provider]  clonazePAM (KLONOPIN) 1 MG tablet Take 1 mg by mouth at bedtime.    [provider]  cloNIDine (CATAPRES) 0.2 MG tablet Take 0.2 mg by mouth 2 (two) times daily.    [provider]  fluPHENAZine decanoate (PROLIXIN) 25 MG/ML injection Inject 125 mg into the muscle every 30 (thirty) days.    [provider]  FLUVOXAMINE MALEATE PO Take 75 mg by mouth.     [provider]  hydrOXYzine (ATARAX/VISTARIL) 25 MG tablet Take 1 tablet (25 mg total) by mouth 3 (three) times daily as needed. 12/12/18   Eustace Moore, MD  traZODone (DESYREL) 150 MG tablet Take by mouth at bedtime.    [provider]  Valbenazine Tosylate (INGREZZA) 40 MG CAPS Take 40 mg by mouth at bedtime.    [provider]  Vitamin D, Cholecalciferol, 10 MCG (400 UNIT) TABS Take 50 mcg by mouth in the morning and at bedtime.     Alver Fisher, RN  misoprostol (CYTOTEC) 200 MCG tablet Place two tabs ( ) in cheek for 30 minutes and then  swallow the night before your appointment 11/13/18 12/12/18  [place] Bing, MD      Allergies    Patient has no known allergies.    Review of Systems   Review of Systems  HENT:         Foreign body in right ear  All other systems reviewed and are negative.   Physical Exam Updated Vital Signs BP 115/61   Pulse 60   Temp 98.6 F (37 C) (Oral)   Resp 18   Ht 5\' 8"  (1.727 m)   Wt 127 kg   SpO2 100%   BMI 42.57 kg/m  Physical Exam Vitals and nursing note reviewed.  Constitutional:      Appearance: Normal appearance.  HENT:     Head: Normocephalic and atraumatic.     Right Ear: Tympanic membrane normal.     Left Ear: Tympanic membrane and ear canal normal.     Ears:     Comments: Insect in right ear canal Eyes:     Conjunctiva/sclera: Conjunctivae normal.  Abdominal:     Tenderness: There is no abdominal tenderness.  Skin:    General: Skin is warm and dry.     Findings: No rash.  Neurological:     Mental Status: She is alert.  Psychiatric:        Mood and Affect: Mood normal.  Behavior: Behavior normal.     ED Results / Procedures / Treatments   Labs (all labs ordered are listed, but only abnormal results are displayed) Labs Reviewed - No data to display  EKG None  Radiology No results found.  Procedures .Foreign Body Removal  Date/Time: 07/05/2022 8:10 PM  Performed by: Maxwell Marion, PA-C Authorized by: Maxwell Marion, PA-C  Consent: Verbal consent obtained. Consent given by: guardian Patient identity confirmed: verbally with patient Body area: ear Location details: right ear  Anesthesia: Local anesthetic: 15 mL viscous lidocaine.  Sedation: Patient sedated: no  Localization method: visualized Removal mechanism: alligator forceps Complexity: simple 1 objects recovered. Objects recovered: Insect Post-procedure assessment: foreign body removed Patient tolerance: patient tolerated the procedure well with no immediate  complications      Medications Ordered in ED Medications  lidocaine (XYLOCAINE) viscous 2% for use in ear (bug extraction) (15 mLs OTIC (EAR) Given 07/05/22 1833)    ED Course/ Medical Decision Making/ A&P                             Medical Decision Making  Patient presents to the ED today for foreign body in her right ear.  She believes she felt something crawling in her ear when she was laying down.  My differentials include foreign body in right ear versus cerumen impaction versus otalgia.  Legs and body of insect were visualized on otoscope exam.  15 mL viscous lidocaine to into the right ear.  15 minutes later insect was activated via alligator forceps.  Patient is hemodynamically stable and safe for discharge home.  Advised to alternate between Tylenol and Ibuprofen as needed for ear pain.       Final Clinical Impression(s) / ED Diagnoses Final diagnoses:  Foreign body of right ear, initial encounter    Rx / DC Orders ED Discharge Orders     None         Maxwell Marion, PA-C 07/05/22 2013    Glyn Ade, MD 07/05/22 2321

## 2022-08-10 ENCOUNTER — Ambulatory Visit: Payer: MEDICAID | Attending: Cardiovascular Disease | Admitting: Cardiovascular Disease

## 2022-08-11 ENCOUNTER — Encounter: Payer: Self-pay | Admitting: Cardiovascular Disease

## 2022-08-23 ENCOUNTER — Emergency Department (HOSPITAL_BASED_OUTPATIENT_CLINIC_OR_DEPARTMENT_OTHER)
Admission: EM | Admit: 2022-08-23 | Discharge: 2022-08-23 | Disposition: A | Discharge status: Home / Self Care | Payer: MEDICAID | Attending: Emergency Medicine | Admitting: Emergency Medicine

## 2022-08-23 ENCOUNTER — Other Ambulatory Visit: Payer: Self-pay

## 2022-08-23 DIAGNOSIS — T162XXA Foreign body in left ear, initial encounter: Secondary | ICD-10-CM | POA: Diagnosis not present

## 2022-08-23 DIAGNOSIS — X58XXXA Exposure to other specified factors, initial encounter: Secondary | ICD-10-CM | POA: Diagnosis not present

## 2022-08-23 MED ORDER — LIDOCAINE VISCOUS HCL 2 % SOLUTION FOR USE IN EAR (ED/BUG EXTRACTION)
15.0000 mL | Freq: Once | OROMUCOSAL | Status: AC
  Administered 2022-08-23: 15 mL via OTIC
  Filled 2022-08-23: qty 15

## 2022-08-23 NOTE — ED Triage Notes (Signed)
Patient presents to ED via POV from home with father. Here with left ear pain. Reports "there is a bug in there". Father expresses concerns that there is fluid in the ear.

## 2022-08-23 NOTE — ED Notes (Signed)
Discharge paperwork given and verbally understood. 

## 2022-08-23 NOTE — ED Provider Notes (Signed)
[place] EMERGENCY DEPARTMENT AT [facility] Provider Note   CSN: [id number] Arrival date & time: 08/23/22  1554     History  Chief Complaint  Patient presents with   Ear Pain    Kyriana Dimaano is a 32 y.o. female with a PMHx of X-linked intellectual disability who presents to the ED with concerns for foreign body to left ear onset PTA.  She notes that she was using her phone and the bug crawled on her phone and then crawled in her ear.  She did not see the bite on her phone because she was afraid that she with the bed.  Denies pain or drainage to the area.  The history is provided by the patient and a parent. No language interpreter was used.       Home Medications Prior to Admission medications   Medication Sig Start Date End Date Taking? Authorizing Provider  acetaminophen (TYLENOL) 500 MG tablet Take 2 tablets (1,000 mg total) by mouth every 8 (eight) hours as needed for mild pain, moderate pain or fever (cramping; 2nd line). 01/17/19   [place] Bing, MD  cholestyramine Lanetta Inch) 4 g packet Take 1 packet by mouth daily. 04/01/20   [provider]  clonazePAM (KLONOPIN) 1 MG tablet Take 1 mg by mouth at bedtime.    [provider]  cloNIDine (CATAPRES) 0.2 MG tablet Take 0.2 mg by mouth 2 (two) times daily.    [provider]  fluPHENAZine decanoate (PROLIXIN) 25 MG/ML injection Inject 125 mg into the muscle every 30 (thirty) days.    [provider]  FLUVOXAMINE MALEATE PO Take 75 mg by mouth.     [provider]  hydrOXYzine (ATARAX/VISTARIL) 25 MG tablet Take 1 tablet (25 mg total) by mouth 3 (three) times daily as needed. 12/12/18   Eustace Moore, MD  traZODone (DESYREL) 150 MG tablet Take by mouth at bedtime.    [provider]  Valbenazine Tosylate (INGREZZA) 40 MG CAPS Take 40 mg by mouth at bedtime.    [provider]  Vitamin D, Cholecalciferol, 10 MCG (400 UNIT) TABS Take 50 mcg by mouth  in the morning and at bedtime.     Alver Fisher, RN  misoprostol (CYTOTEC) 200 MCG tablet Place two tabs ( ) in cheek for 30 minutes and then swallow the night before your appointment 11/13/18 12/12/18  [place] Bing, MD      Allergies    Patient has no known allergies.    Review of Systems   Review of Systems  All other systems reviewed and are negative.  Physical Exam Updated Vital Signs BP (!) 111/59   Pulse (!) 45   Temp 97.7 F (36.5 C) (Oral)   Resp 17   SpO2 98%  Physical Exam Vitals and nursing note reviewed.  Constitutional:      General: She is not in acute distress.    Appearance: Normal appearance.  HENT:     Right Ear: Hearing, tympanic membrane, ear canal and external ear normal. No foreign body. No mastoid tenderness.     Left Ear: Hearing, tympanic membrane and external ear normal. A foreign body is present. No mastoid tenderness.     Ears:     Comments: Insect noted to left ear canal. No appreciable perforation noted. No appreciable erythema noted to left canal or TM. Right ear canal WNL without concerns for erythema to TM or canal. No appreciable TTP noted to bilateral mastoids.  Eyes:  General: No scleral icterus.    Extraocular Movements: Extraocular movements intact.  Cardiovascular:     Rate and Rhythm: Normal rate.  Pulmonary:     Effort: Pulmonary effort is normal. No respiratory distress.  Abdominal:     Palpations: Abdomen is soft. There is no mass.     Tenderness: There is no abdominal tenderness.  Musculoskeletal:        General: Normal range of motion.     Cervical back: Neck supple.  Skin:    General: Skin is warm and dry.     Findings: No rash.  Neurological:     Mental Status: She is alert.     Sensory: Sensation is intact.     Motor: Motor function is intact.  Psychiatric:        Behavior: Behavior normal.     ED Results / Procedures / Treatments   Labs (all labs ordered are listed, but only abnormal results are  displayed) Labs Reviewed - No data to display  EKG None  Radiology No results found.  Procedures .Foreign Body Removal  Date/Time: 08/23/2022 6:24 PM  Performed by: Karenann Cai, PA-C Authorized by: Karenann Cai, PA-C  Consent: Verbal consent obtained. Risks and benefits: risks, benefits and alternatives were discussed Consent given by: patient, parent and guardian Patient understanding: patient states understanding of the procedure being performed Patient identity confirmed: verbally with patient and hospital-assigned identification number Time out: Immediately prior to procedure a "time out" was called to verify the correct patient, procedure, equipment, support staff and site/side marked as required. Body area: ear Location details: left ear  Anesthesia: Local Anesthetic: topical anesthetic Anesthetic total: 15 mL  Sedation: Patient sedated: no  Localization method: visualized (otoscope) Removal mechanism: alligator forceps Complexity: simple 1 objects recovered. Objects recovered: insect Post-procedure assessment: foreign body removed Patient tolerance: patient tolerated the procedure well with no immediate complications      Medications Ordered in ED Medications  lidocaine (XYLOCAINE) viscous 2% for use in ear (bug extraction) (has no administration in time range)    ED Course/ Medical Decision Making/ A&P                             Medical Decision Making Risk Prescription drug management.   Pt presents with concerns for insect to left ear onset PTA. History of similar symptoms. Vital signs, pt afebrile. On exam, pt with insect noted to left canal. No overlying erythema or TTP noted to mastoid. No appreciable drainage noted. No acute cardiovascular, respiratory, abdominal exam findings.    Additional history obtained:  External records from outside source obtained and reviewed including: Pt has multiple visits to the ED with concerns for foreign  body to her ear.   Medications:  I ordered medication including viscous lidocaine placed to left ear for 15 minutes prior to foreign body removal. Visualization of ear following insect removal notes no perforation, erythema to TM or canal.  I have reviewed the patients home medicines and have made adjustments as needed   Disposition: Presentation suspicious for foreign body to left ear. Doubt concerns for otitis media or externa at this time. Visualization of ear following insect removal notes no perforation, erythema to TM or canal. After consideration of the diagnostic results and the patients response to treatment, I feel that the patient would benefit from Discharge home. Supportive care measures and strict return precautions discussed with patient at bedside. Pt acknowledges and verbalizes  understanding. Pt appears safe for discharge. Follow up as indicated in discharge paperwork.    This chart was dictated using voice recognition software, Dragon. Despite the best efforts of this provider to proofread and correct errors, errors may still occur which can change documentation meaning.   Final Clinical Impression(s) / ED Diagnoses Final diagnoses:  Foreign body of left ear, initial encounter    Rx / DC Orders ED Discharge Orders     None         Ilithyia Titzer A, PA-C 08/23/22 1831    Zadie Rhine, MD 08/23/22 [phone]

## 2022-08-23 NOTE — Discharge Instructions (Addendum)
It was a pleasure taking care of you today!  The bug was removed from your ear today. Alternate between over the counter Tylenol and Ibuprofen every 4 hours as needed for ear pain. Follow up with your primary care provider if you are experiencing increasing/worsening symptoms. Return to ED if you are noticing blood/fluid from your ear, sudden hearing loss, or worsening symptoms.

## 2022-10-11 ENCOUNTER — Other Ambulatory Visit: Payer: Self-pay

## 2022-10-11 ENCOUNTER — Emergency Department (HOSPITAL_BASED_OUTPATIENT_CLINIC_OR_DEPARTMENT_OTHER)
Admission: EM | Admit: 2022-10-11 | Discharge: 2022-10-11 | Disposition: A | Discharge status: Home / Self Care | Payer: MEDICAID | Attending: Emergency Medicine | Admitting: Emergency Medicine

## 2022-10-11 ENCOUNTER — Encounter (HOSPITAL_BASED_OUTPATIENT_CLINIC_OR_DEPARTMENT_OTHER): Payer: Self-pay | Admitting: Urology

## 2022-10-11 DIAGNOSIS — X58XXXA Exposure to other specified factors, initial encounter: Secondary | ICD-10-CM | POA: Insufficient documentation

## 2022-10-11 DIAGNOSIS — T161XXA Foreign body in right ear, initial encounter: Secondary | ICD-10-CM | POA: Insufficient documentation

## 2022-10-11 NOTE — ED Provider Notes (Signed)
[place] [facility] AT [facility] Provider Note   CSN: [id number] Arrival date & time: 10/11/22  1829     History  Chief Complaint  Patient presents with   Insect in ear     Melissa Griffin is a 32 y.o. female.  Complains of a bug in her right ear.  Patient felt to go in there.  Patient reports that she thinks it went in on Saturday.  Patient reports she thinks the dog is dead.  Patient denies any other problems she has not had any fever or chills.  Patient denies any hearing loss  The history is provided by the patient. No language interpreter was used.       Home Medications Prior to Admission medications   Medication Sig Start Date End Date Taking? Authorizing Provider  acetaminophen (TYLENOL) 500 MG tablet Take 2 tablets (1,000 mg total) by mouth every 8 (eight) hours as needed for mild pain, moderate pain or fever (cramping; 2nd line). 01/17/19   [place] Bing, MD  cholestyramine Lanetta Inch) 4 g packet Take 1 packet by mouth daily. 04/01/20   [provider]  clonazePAM (KLONOPIN) 1 MG tablet Take 1 mg by mouth at bedtime.    [provider]  cloNIDine (CATAPRES) 0.2 MG tablet Take 0.2 mg by mouth 2 (two) times daily.    [provider]  fluPHENAZine decanoate (PROLIXIN) 25 MG/ML injection Inject 125 mg into the muscle every 30 (thirty) days.    [provider]  FLUVOXAMINE MALEATE PO Take 75 mg by mouth.     [provider]  hydrOXYzine (ATARAX/VISTARIL) 25 MG tablet Take 1 tablet (25 mg total) by mouth 3 (three) times daily as needed. 12/12/18   Eustace Moore, MD  traZODone (DESYREL) 150 MG tablet Take by mouth at bedtime.    [provider]  Valbenazine Tosylate (INGREZZA) 40 MG CAPS Take 40 mg by mouth at bedtime.    [provider]  Vitamin D, Cholecalciferol, 10 MCG (400 UNIT) TABS Take 50 mcg by mouth in the morning and at bedtime.     Alver Fisher, RN  misoprostol (CYTOTEC)  200 MCG tablet Place two tabs ( ) in cheek for 30 minutes and then swallow the night before your appointment 11/13/18 12/12/18  [place]andel[place] Bing, MD      Allergies    Patient has no known allergies.    Review of Systems   Review of Systems  HENT:  Positive for ear pain.   All other systems reviewed and are negative.   Physical Exam Updated Vital Signs Ht 5\' 8"  (1.727 m)   Wt 127 kg   BMI 42.57 kg/m  Physical Exam Vitals reviewed.  Constitutional:      Appearance: Normal appearance.  HENT:     Ears:     Comments: Right TM occluded by bug and ear canal Cardiovascular:     Rate and Rhythm: Normal rate.  Pulmonary:     Effort: Pulmonary effort is normal.  Neurological:     General: No focal deficit present.     Mental Status: She is alert.     ED Results / Procedures / Treatments   Labs (all labs ordered are listed, but only abnormal results are displayed) Labs Reviewed - No data to display  EKG None  Radiology No results found.  Procedures .Foreign Body Removal  Date/Time: 10/11/2022 8:26 PM  Performed by: Elson Areas, PA-C Authorized by: Elson Areas, PA-C  Consent: Verbal  consent obtained. Consent given by: patient Patient understanding: patient states understanding of the procedure being performed Patient identity confirmed: verbally with patient Body area: ear Location details: right ear Removal mechanism: alligator forceps Complexity: simple 1 objects recovered. Objects recovered: roach Post-procedure assessment: foreign body removed Patient tolerance: patient tolerated the procedure well with no immediate complications      Medications Ordered in ED Medications - No data to display  ED Course/ Medical Decision Making/ A&P                                 Medical Decision Making          Final Clinical Impression(s) / ED Diagnoses Final diagnoses:  Foreign body of right ear, initial encounter    Rx / DC Orders ED  Discharge Orders     None     An After Visit Summary was printed and given to the patient.     Osie Cheeks 10/11/22 2027    Maia Plan, MD 10/11/22 2103

## 2022-10-11 NOTE — ED Notes (Signed)
Discharge paperwork reviewed entirely with patient, including follow up care. Pain was under control. The patient received instruction and coaching on their prescriptions, and all follow-up questions were answered.  Pt verbalized understanding as well as all parties involved. No questions or concerns voiced at the time of discharge. No acute distress noted.   Pt ambulated out to PVA without incident or assistance.  

## 2022-10-11 NOTE — ED Triage Notes (Signed)
Pt states feels like there is a insect in her right ear x 3 days, states it is getting bigger

## 2022-11-03 ENCOUNTER — Emergency Department (HOSPITAL_BASED_OUTPATIENT_CLINIC_OR_DEPARTMENT_OTHER)
Admission: EM | Admit: 2022-11-03 | Discharge: 2022-11-03 | Disposition: A | Discharge status: Home / Self Care | Payer: MEDICAID

## 2022-11-03 ENCOUNTER — Encounter (HOSPITAL_BASED_OUTPATIENT_CLINIC_OR_DEPARTMENT_OTHER): Payer: Self-pay

## 2022-11-03 ENCOUNTER — Other Ambulatory Visit: Payer: Self-pay

## 2022-11-03 DIAGNOSIS — X58XXXA Exposure to other specified factors, initial encounter: Secondary | ICD-10-CM | POA: Insufficient documentation

## 2022-11-03 DIAGNOSIS — T161XXA Foreign body in right ear, initial encounter: Secondary | ICD-10-CM | POA: Insufficient documentation

## 2022-11-03 MED ORDER — LIDOCAINE VISCOUS HCL 2 % MT SOLN
15.0000 mL | Freq: Once | OROMUCOSAL | Status: AC
  Administered 2022-11-03: 15 mL via OROMUCOSAL
  Filled 2022-11-03: qty 15

## 2022-11-03 NOTE — ED Notes (Signed)
Pt ear irrigated and insect removed with alligator forceps by PA Schuman. Pt tolerated well and educated on the cons of placing bugs in her ears. Pt verbalized understanding and states she puts them in her ears so she can sleep.

## 2022-11-03 NOTE — Discharge Instructions (Signed)
Please follow-up with primary care provider in regards to recent ER visit.  Today we were able to extract the bug in your ear.  Please avoid placing bugs in the ear in the future.  If symptoms change or worsen please return to ER.

## 2022-11-03 NOTE — ED Triage Notes (Signed)
Pt states she has a "roach in her rt. Ear"

## 2022-11-03 NOTE — ED Provider Notes (Signed)
[place] [facility] Provider Note   CSN: [id number] Arrival date & time: 11/03/22  1941     History  Chief Complaint  Patient presents with   Foreign Body in Ear    Melissa Griffin is a 32 y.o. 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  Father is present to assist in history.  Father states that she checked the blood comfort during-year-old to which the patient denies.  Patient was seen well times in the past for bugs in the ear and denies any hearing changes and states that she just wants the blood taken out.   Home Medications Prior to Admission medications   Medication Sig Start Date End Date Taking? Authorizing Provider  acetaminophen (TYLENOL) 500 MG tablet Take 2 tablets (1,000 mg total) by mouth every 8 (eight) hours as needed for mild pain, moderate pain or fever (cramping; 2nd line). 01/17/19   [place] Bing, MD  cholestyramine Lanetta Inch) 4 g packet Take 1 packet by mouth daily. 04/01/20   [provider]  clonazePAM (KLONOPIN) 1 MG tablet Take 1 mg by mouth at bedtime.    [provider]  cloNIDine (CATAPRES) 0.2 MG tablet Take 0.2 mg by mouth 2 (two) times daily.    [provider]  fluPHENAZine decanoate (PROLIXIN) 25 MG/ML injection Inject 125 mg into the muscle every 30 (thirty) days.    [provider]  FLUVOXAMINE MALEATE PO Take 75 mg by mouth.     [provider]  hydrOXYzine (ATARAX/VISTARIL) 25 MG tablet Take 1 tablet (25 mg total) by mouth 3 (three) times daily as needed. 12/12/18   Eustace Moore, MD  traZODone (DESYREL) 150 MG tablet Take by mouth at bedtime.    [provider]  Valbenazine Tosylate (INGREZZA) 40 MG CAPS Take 40 mg by mouth at bedtime.    [provider]  Vitamin D, Cholecalciferol, 10 MCG (400 UNIT) TABS Take 50 mcg by mouth in the morning and at bedtime.     Alver Fisher, RN  misoprostol  (CYTOTEC) 200 MCG tablet Place two tabs ( ) in cheek for 30 minutes and then swallow the night before your appointment 11/13/18 12/12/18  [place] Bing, MD      Allergies    Patient has no known allergies.    Review of Systems   Review of Systems  Physical Exam Updated Vital Signs BP 105/81 (BP Location: Right Arm)   Pulse 90   Resp 18   Ht 5\' 8"  (1.727 m)   Wt 127 kg   SpO2 98%   BMI 42.57 kg/m  Physical Exam Constitutional:      General: She is not in acute distress. HENT:     Ears:     Comments: Right ear: Dead insect noted, tympanic membrane does appear intact After insect was removed tympanic membrane was intact without abnormalities    Nose: Nose normal.     Mouth/Throat:     Mouth: Mucous membranes are moist.  Eyes:     Extraocular Movements: Extraocular movements intact.     Conjunctiva/sclera: Conjunctivae normal.     Pupils: Pupils are equal, round, and reactive to light.  Neurological:     Mental Status: She is alert.     ED Results / Procedures / Treatments   Labs (all labs ordered are listed, but only abnormal results are displayed) Labs Reviewed - No data to display  EKG None  Radiology No  results found.  Procedures .Foreign Body Removal  Date/Time: 11/03/2022 9:24 PM  Performed by: Netta Corrigan, PA-C Authorized by: Netta Corrigan, PA-C  Consent: Verbal consent obtained. Risks and benefits: risks, benefits and alternatives were discussed Consent given by: patient and parent Patient understanding: patient states understanding of the procedure being performed Patient identity confirmed: verbally with patient Body area: ear Location details: right ear Localization method: ENT speculum Removal mechanism: alligator forceps Complexity: simple 1 objects recovered. Objects recovered: Insect Post-procedure assessment: foreign body removed Patient tolerance: patient tolerated the procedure well with no immediate complications       Medications Ordered in ED Medications  lidocaine (XYLOCAINE) 2 % viscous mouth solution 15 mL (15 mLs Mouth/Throat Given 11/03/22 2051)    ED Course/ Medical Decision Making/ A&P                                 Medical Decision Making Risk Prescription drug management.   Melissa Griffin 32 y.o. presented today for foreign body in ear. Working DDx that I considered at this time includes, but not limited to, foreign body in ear, tympanic membrane perforation.  R/o DDx: Tympanic membrane perforation with: These are considered less likely due to history of present illness, physical exam, labs/imaging findings  Review of prior external notes: 10/11/2022 ED  Unique Tests and My Interpretation: None  Discussion with Independent Historian:  Father  Discussion of Management of Tests: None  Risk: Low: based on diagnostic testing/clinical impression and treatment plan  Risk Stratification Score: None  Plan: On exam patient was in no acute distress stable vitals.  On exam the does appear to be an insect in her right ear and so viscous lidocaine was placed and had normal the bug was successfully removed without tympanic membrane perforation or complications.  Encouraged patient to follow-up with primary care provider and to return to ER symptoms are change or worsen.  Patient was given return precautions. Patient stable for discharge at this time.  Patient verbalized understanding of plan.         Final Clinical Impression(s) / ED Diagnoses Final diagnoses:  Foreign body of right ear, initial encounter    Rx / DC Orders ED Discharge Orders     None         [name] 11/03/22 2126    Coral Spikes, DO 11/03/22 2311

## 2022-12-25 IMAGING — CT CT RENAL STONE PROTOCOL
2 of 4 series · 16 of 46 positions shown, 18 images · non-contrast
Comparison: 08/15/2014

CLINICAL DATA: Left flank pain, COVID positive

EXAM:
CT ABDOMEN AND PELVIS WITHOUT CONTRAST
TECHNIQUE: Multidetector CT imaging of the abdomen and pelvis was performed
following the standard protocol without IV contrast.

[Series 3: axial st · axial · 0.85mm/px · z∈[-435,-15]mm · 13 of 92 slices shown, 15 images]
[im 4/92  soft-tissue]
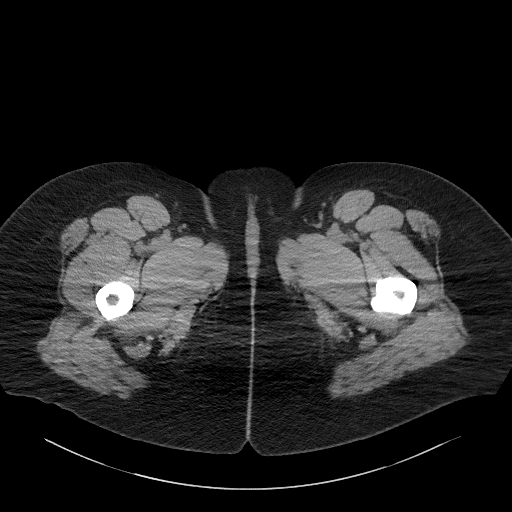
[im 4/92  bone]
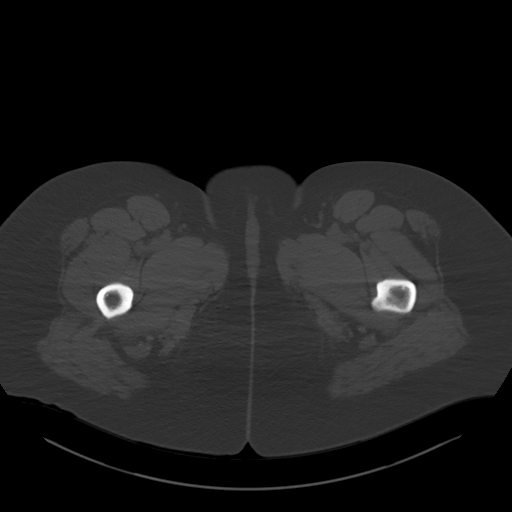
[im 11/92  soft-tissue]
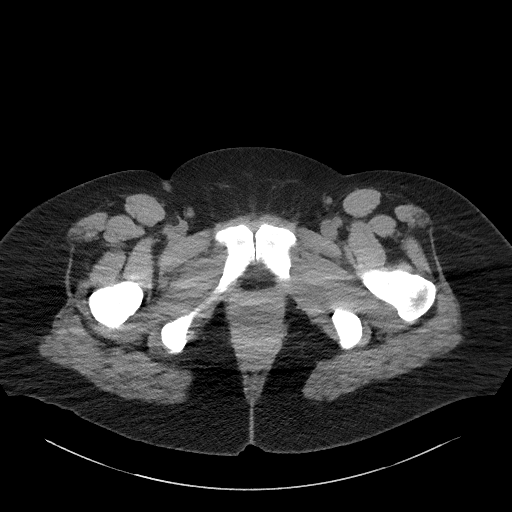
[im 18/92  soft-tissue]
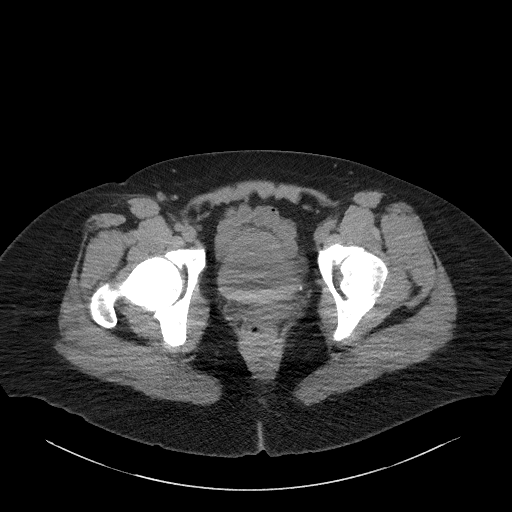
[im 25/92  soft-tissue]
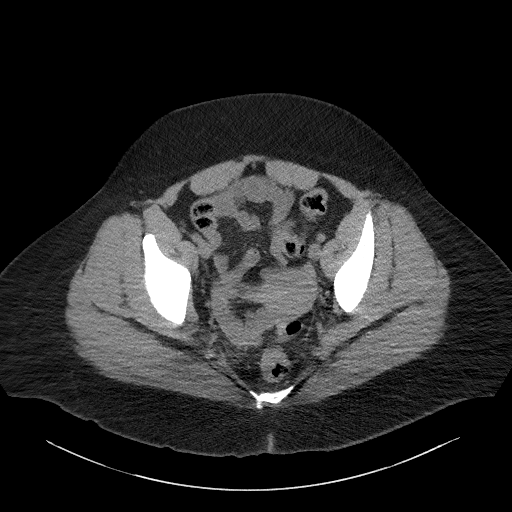
[im 32/92  soft-tissue]
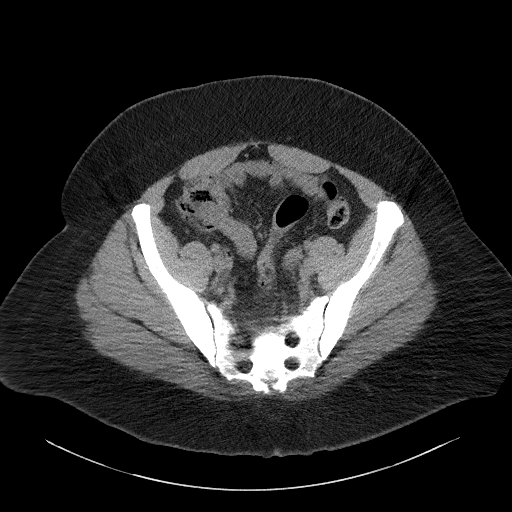
[im 39/92  soft-tissue]
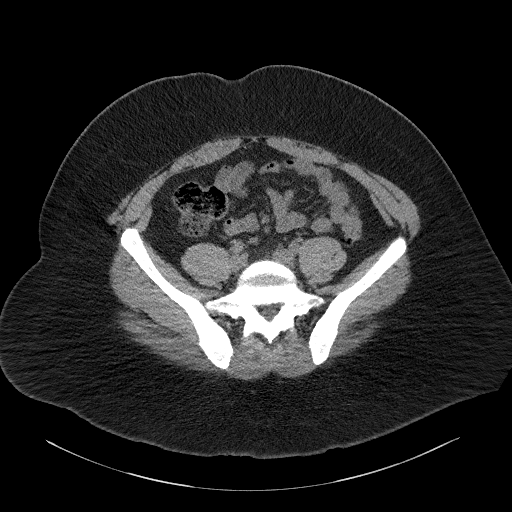
[im 46/92  soft-tissue]
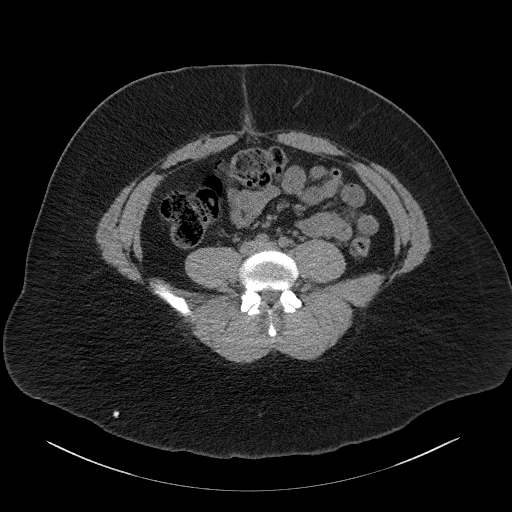
[im 53/92  soft-tissue]
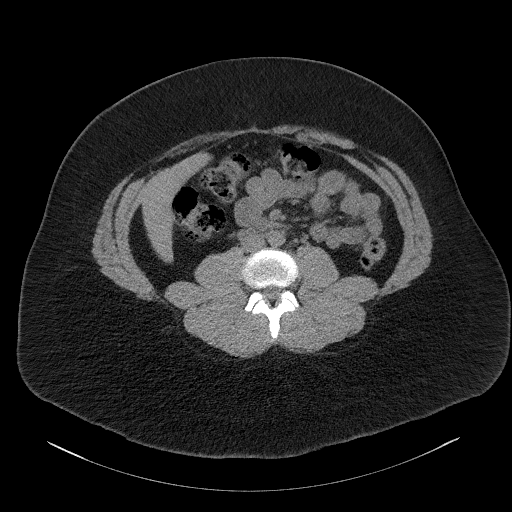
[im 60/92  soft-tissue]
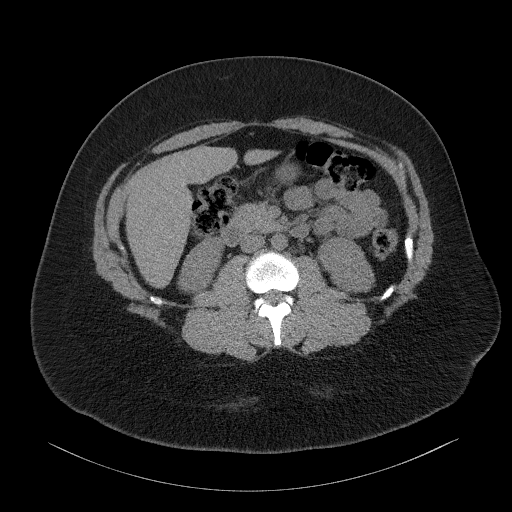
[im 60/92  bone]
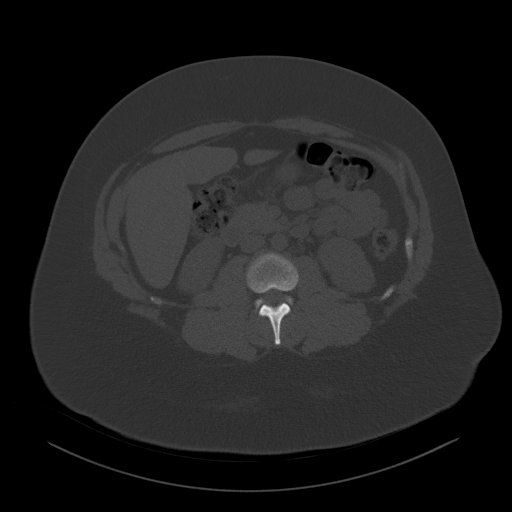
[im 67/92  soft-tissue]
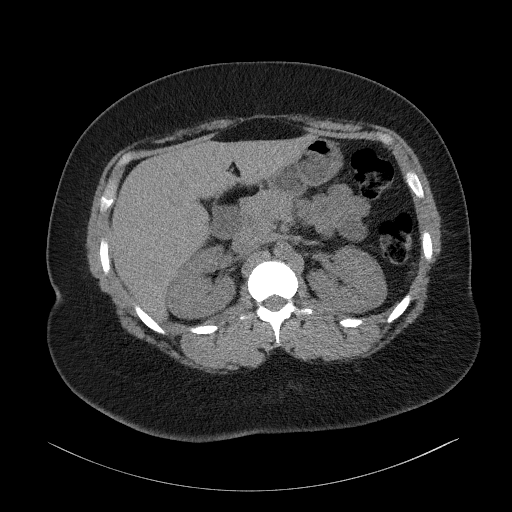
[im 74/92  soft-tissue]
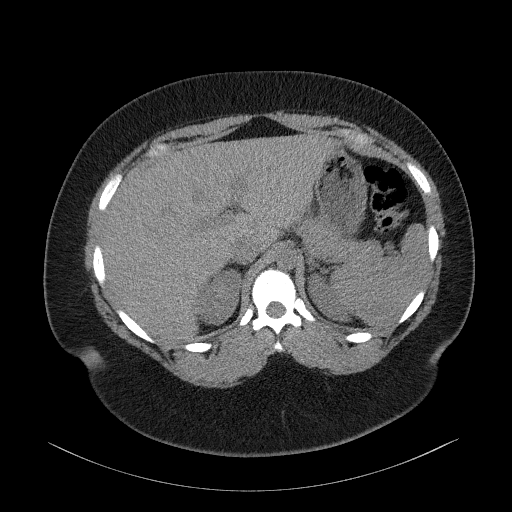
[im 81/92  soft-tissue]
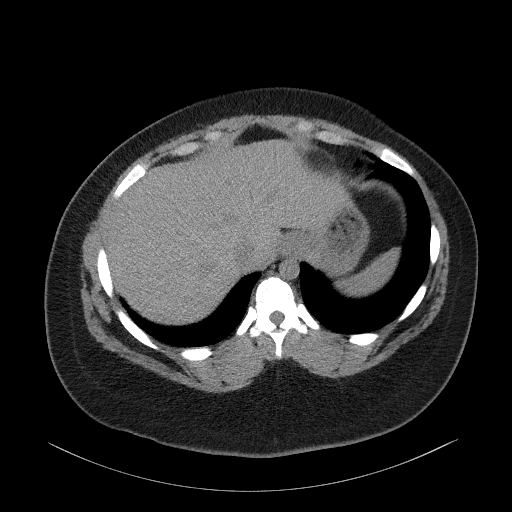
[im 88/92  soft-tissue]
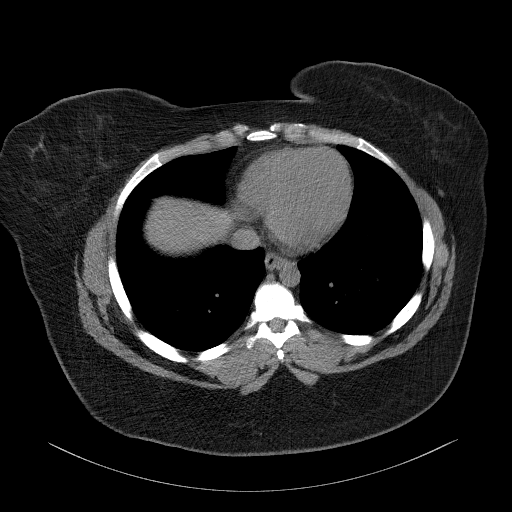

[Series 6: coronal st · coronal · 0.76mm/px · 3 of 94 slices shown]
[im 32/94  soft-tissue]
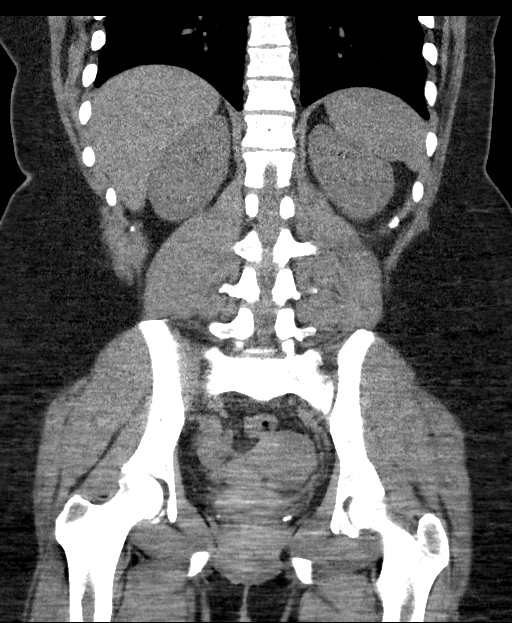
[im 42/94  soft-tissue]
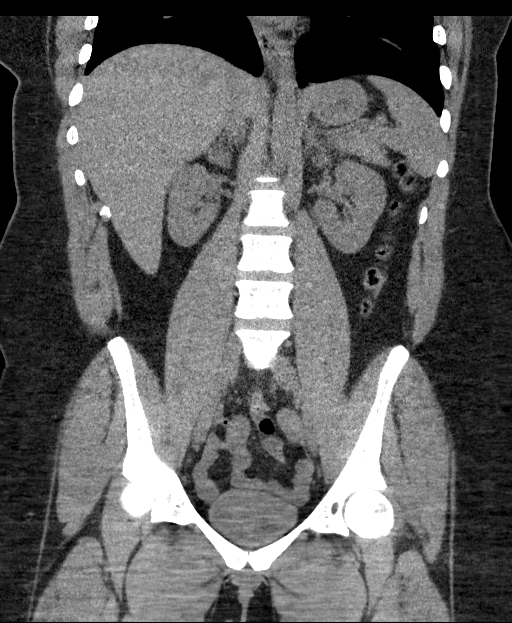
[im 52/94  soft-tissue]
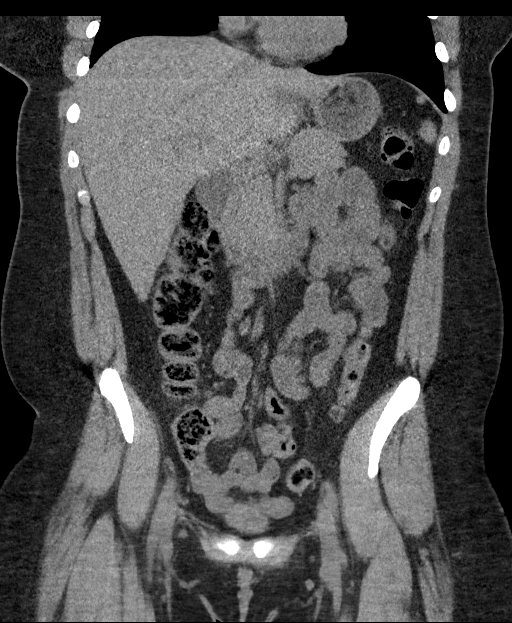

[16 of 46 positions shown; findings below may reference images not displayed]

FINDINGS: Lower chest: The visualized lung bases are clear. The visualized
heart and pericardium are unremarkable.

Hepatobiliary: Mild hepatomegaly, slightly progressive since prior
examination. No definite focal intrahepatic mass identified on this
noncontrast examination. No intra or extrahepatic biliary ductal
dilation. Cholecystectomy has been performed.

Pancreas: Unremarkable

Spleen: Unremarkable

Adrenals/Urinary Tract: Adrenal glands are unremarkable. Kidneys are
normal, without renal calculi, focal lesion, or hydronephrosis.
Bladder is unremarkable.

Stomach/Bowel: Stomach is within normal limits. Appendix appears
normal. No evidence of bowel wall thickening, distention, or
inflammatory changes. No free intraperitoneal gas or fluid.

Vascular/Lymphatic: No significant vascular findings are present. No
enlarged abdominal or pelvic lymph nodes.

Reproductive: Uterus and bilateral adnexa are unremarkable.

Other: No abdominal wall hernia.  Rectum unremarkable.

Musculoskeletal: No acute bone abnormality. No lytic or blastic bone
lesions are identified.
IMPRESSION: No acute intra-abdominal pathology identified. No radiographic
explanation for the patient's reported symptoms.

Progressive mild hepatomegaly.

## 2023-01-16 ENCOUNTER — Other Ambulatory Visit: Payer: Self-pay

## 2023-01-16 ENCOUNTER — Emergency Department (HOSPITAL_BASED_OUTPATIENT_CLINIC_OR_DEPARTMENT_OTHER)
Admission: EM | Admit: 2023-01-16 | Discharge: 2023-01-16 | Disposition: A | Discharge status: Home / Self Care | Payer: MEDICAID | Attending: Emergency Medicine | Admitting: Emergency Medicine

## 2023-01-16 ENCOUNTER — Emergency Department (HOSPITAL_BASED_OUTPATIENT_CLINIC_OR_DEPARTMENT_OTHER): Payer: MEDICAID

## 2023-01-16 ENCOUNTER — Encounter (HOSPITAL_BASED_OUTPATIENT_CLINIC_OR_DEPARTMENT_OTHER): Payer: Self-pay | Admitting: Emergency Medicine

## 2023-01-16 DIAGNOSIS — D72829 Elevated white blood cell count, unspecified: Secondary | ICD-10-CM | POA: Diagnosis not present

## 2023-01-16 DIAGNOSIS — E876 Hypokalemia: Secondary | ICD-10-CM | POA: Insufficient documentation

## 2023-01-16 DIAGNOSIS — R0781 Pleurodynia: Secondary | ICD-10-CM | POA: Insufficient documentation

## 2023-01-16 DIAGNOSIS — D75839 Thrombocytosis, unspecified: Secondary | ICD-10-CM | POA: Diagnosis not present

## 2023-01-16 DIAGNOSIS — R1012 Left upper quadrant pain: Secondary | ICD-10-CM | POA: Insufficient documentation

## 2023-01-16 LAB — URINALYSIS, ROUTINE W REFLEX MICROSCOPIC
Glucose, UA: NEGATIVE mg/dL
Hgb urine dipstick: NEGATIVE
Ketones, ur: NEGATIVE mg/dL
Leukocytes,Ua: NEGATIVE
Nitrite: NEGATIVE
Protein, ur: 30 mg/dL — AB
Specific Gravity, Urine: >=1.03 (ref 1.005–1.030)
pH: 5.5 (ref 5.0–8.0)

## 2023-01-16 LAB — CBC WITH DIFFERENTIAL/PLATELET
Abs Immature Granulocytes: 0.06 10*3/uL (ref 0.00–0.07)
Basophils Absolute: 0.1 10*3/uL (ref 0.0–0.1)
Basophils Relative: 1 %
Eosinophils Absolute: 0.2 10*3/uL (ref 0.0–0.5)
Eosinophils Relative: 2 %
HCT: 37.8 % (ref 36.0–46.0)
Hemoglobin: 12 g/dL (ref 12.0–15.0)
Immature Granulocytes: 1 %
Lymphocytes Relative: 36 %
Lymphs Abs: 4.2 10*3/uL — ABNORMAL HIGH (ref 0.7–4.0)
MCH: 26.8 pg (ref 26.0–34.0)
MCHC: 31.7 g/dL (ref 30.0–36.0)
MCV: 84.6 fL (ref 80.0–100.0)
Monocytes Absolute: 0.8 10*3/uL (ref 0.1–1.0)
Monocytes Relative: 7 %
Neutro Abs: 6.4 10*3/uL (ref 1.7–7.7)
Neutrophils Relative %: 53 %
Platelets: 408 10*3/uL — ABNORMAL HIGH (ref 150–400)
RBC: 4.47 MIL/uL (ref 3.87–5.11)
RDW: 13.3 % (ref 11.5–15.5)
WBC: 11.7 10*3/uL — ABNORMAL HIGH (ref 4.0–10.5)
nRBC: 0 % (ref 0.0–0.2)

## 2023-01-16 LAB — COMPREHENSIVE METABOLIC PANEL
ALT: 13 U/L (ref 0–44)
AST: 16 U/L (ref 15–41)
Albumin: 3.7 g/dL (ref 3.5–5.0)
Alkaline Phosphatase: 76 U/L (ref 38–126)
Anion gap: 9 (ref 5–15)
BUN: 9 mg/dL (ref 6–20)
CO2: 22 mmol/L (ref 22–32)
Calcium: 8.8 mg/dL — ABNORMAL LOW (ref 8.9–10.3)
Chloride: 106 mmol/L (ref 98–111)
Creatinine, Ser: 0.97 mg/dL (ref 0.44–1.00)
GFR, Estimated: >60 mL/min (ref >60)
Glucose, Bld: 113 mg/dL — ABNORMAL HIGH (ref 70–99)
Potassium: 3.2 mmol/L — ABNORMAL LOW (ref 3.5–5.1)
Sodium: 137 mmol/L (ref 135–145)
Total Bilirubin: 0.6 mg/dL (ref <1.2)
Total Protein: 7.5 g/dL (ref 6.5–8.1)

## 2023-01-16 LAB — LIPASE, BLOOD: Lipase: 38 U/L (ref 11–51)

## 2023-01-16 LAB — URINALYSIS, MICROSCOPIC (REFLEX)

## 2023-01-16 LAB — PREGNANCY, URINE: Preg Test, Ur: NEGATIVE

## 2023-01-16 LAB — MAGNESIUM: Magnesium: 1.9 mg/dL (ref 1.7–2.4)

## 2023-01-16 LAB — TROPONIN I (HIGH SENSITIVITY): Troponin I (High Sensitivity): 2 ng/L (ref <18)

## 2023-01-16 MED ORDER — ACETAMINOPHEN 500 MG PO TABS: 1000.0000 mg | ORAL_TABLET | Freq: Once | ORAL | Status: DC

## 2023-01-16 MED ORDER — KETOROLAC TROMETHAMINE 15 MG/ML IJ SOLN
15.0000 mg | Freq: Once | INTRAMUSCULAR | Status: AC
  Administered 2023-01-16: 15 mg via INTRAVENOUS
  Filled 2023-01-16: qty 1

## 2023-01-16 MED ORDER — ONDANSETRON 4 MG PO TBDP
8.0000 mg | ORAL_TABLET | Freq: Once | ORAL | Status: AC
  Administered 2023-01-16: 8 mg via ORAL
  Filled 2023-01-16: qty 2

## 2023-01-16 NOTE — ED Triage Notes (Signed)
Pt c/o upper middle CP x 1 hr; also reports back pain, but has indicated multiple areas of back since it started; vomited x 1; reports chest and back pain increase with movement

## 2023-01-16 NOTE — ED Provider Notes (Signed)
Signout from [employer] at shift change. Briefly, patient presents for L flank, lower chest pain. Recurrent. + vomiting.    Plan: CT renal, f/u labs, reassess   7:28 PM Reassessment performed. Patient appears very comfortable.  She reports improvement after the medications given earlier.  Family member at bedside.  Labs and imaging personally reviewed and interpreted including: CBC minimally elevated white blood cell count at 11.7 otherwise unremarkable; mild hypokalemia on CMP, normal kidney function; lipase normal; magnesium normal; UA without compelling signs of infection.   Reviewed additional pertinent lab work and imaging with patient at bedside including: CT renal protocol reassuring.   Most current vital signs reviewed and are as follows: BP 129/76 (BP Location: Right Arm)   Pulse (!) 48   Temp 98.5 F (36.9 C) (Oral)   Resp 18   Ht 5\' 7"  (1.702 m)   Wt 130.2 kg   SpO2 99%   BMI 44.95 kg/m     Plan: Plan for discharged home with continued use of medications.  Patient is taking MiraLAX.    Return and follow-up instructions: Encouraged return to ED with worsening pain, persistent vomiting, fever. Encouraged patient to follow-up with their provider in 3 days. Patient verbalized understanding and agreed with plan.        Renne Crigler, PA-C 01/16/23 1929    Glyn Ade, MD 01/16/23 [phone]

## 2023-01-16 NOTE — Discharge Instructions (Signed)
Please read and follow all provided instructions.  Your diagnoses today include:  1. Left upper quadrant abdominal pain     Tests performed today include: Blood cell counts and platelets Kidney and liver function tests Pancreas function test (called lipase) Urine test to look for infection A blood or urine test for pregnancy (women only) Vital signs. See below for your results today.   Medications prescribed:   Take any prescribed medications only as directed.  Home care instructions:  Follow any educational materials contained in this packet.  Follow-up instructions: Please follow-up with your primary care provider in the next 2 days for further evaluation of your symptoms.    Return instructions:  SEEK IMMEDIATE MEDICAL ATTENTION IF: The pain does not go away or becomes severe  A temperature above 101F develops  Repeated vomiting occurs (multiple episodes)  The pain becomes localized to portions of the abdomen. The right side could possibly be appendicitis. In an adult, the left lower portion of the abdomen could be colitis or diverticulitis.  Blood is being passed in stools or vomit (bright red or black tarry stools)  You develop chest pain, difficulty breathing, dizziness or fainting, or become confused, poorly responsive, or inconsolable (young children) If you have any other emergent concerns regarding your health  Additional Information: Abdominal (belly) pain can be caused by many things. Your caregiver performed an examination and possibly ordered blood/urine tests and imaging (CT scan, x-rays, ultrasound). Many cases can be observed and treated at home after initial evaluation in the emergency department. Even though you are being discharged home, abdominal pain can be unpredictable. Therefore, you need a repeated exam if your pain does not resolve, returns, or worsens. Most patients with abdominal pain don't have to be admitted to the hospital or have surgery, but  serious problems like appendicitis and gallbladder attacks can start out as nonspecific pain. Many abdominal conditions cannot be diagnosed in one visit, so follow-up evaluations are very important.  Your vital signs today were: BP 129/76 (BP Location: Right Arm)   Pulse (!) 48   Temp 98.5 F (36.9 C) (Oral)   Resp 18   Ht 5\' 7"  (1.702 m)   Wt 130.2 kg   SpO2 99%   BMI 44.95 kg/m  If your blood pressure (bp) was elevated above 135/85 this visit, please have this repeated by your doctor within one month. --------------

## 2023-01-16 NOTE — ED Provider Notes (Signed)
[place] [facility] AT [facility] Provider Note   CSN: [id number] Arrival date & time: 01/16/23  1716     History  Chief Complaint  Patient presents with   Chest Pain    Melissa Griffin is a 32 y.o. female.   Chest Pain   32 year old female presents emergency department with complaints of flank pain.  Patient states that symptoms began when she was about sitting on the toilet straining to have a bowel movement.  States that she feels like she has had a "turd stuck in my belly for over a year" and was "trying to push it out."  Patient reports sharp onset left-sided flank pain with some radiation to her left upper abdomen.  States that symptoms seem to go away for several minutes and then returned when she was laying back down in bed subsequently prompting visit to the emergency department.  Does report feeling nauseated and having a couple episodes of emesis when the pain was more intense.  Denies any shortness of breath, coughing, urinary symptoms, vaginal symptoms, change in bowel habits.  Past abdominal surgical history includes cholecystectomy, appendectomy, D&C.  Past medical history significant for schizoaffective disorder, GERD, seizure, X-linked intellectual disability, obesity, dysmenorrhea, menorrhagia,  Home Medications Prior to Admission medications   Medication Sig Start Date End Date Taking? Authorizing Provider  acetaminophen (TYLENOL) 500 MG tablet Take 2 tablets (1,000 mg total) by mouth every 8 (eight) hours as needed for mild pain, moderate pain or fever (cramping; 2nd line). 01/17/19   [place] Bing, MD  cholestyramine Lanetta Inch) 4 g packet Take 1 packet by mouth daily. 04/01/20   [provider]  clonazePAM (KLONOPIN) 1 MG tablet Take 1 mg by mouth at bedtime.    [provider]  cloNIDine (CATAPRES) 0.2 MG tablet Take 0.2 mg by mouth 2 (two) times daily.    [provider]  fluPHENAZine decanoate (PROLIXIN) 25  MG/ML injection Inject 125 mg into the muscle every 30 (thirty) days.    [provider]  FLUVOXAMINE MALEATE PO Take 75 mg by mouth.     [provider]  hydrOXYzine (ATARAX/VISTARIL) 25 MG tablet Take 1 tablet (25 mg total) by mouth 3 (three) times daily as needed. 12/12/18   Eustace Moore, MD  traZODone (DESYREL) 150 MG tablet Take by mouth at bedtime.    [provider]  Valbenazine Tosylate (INGREZZA) 40 MG CAPS Take 40 mg by mouth at bedtime.    [provider]  Vitamin D, Cholecalciferol, 10 MCG (400 UNIT) TABS Take 50 mcg by mouth in the morning and at bedtime.     Alver Fisher, RN  misoprostol (CYTOTEC) 200 MCG tablet Place two tabs ( ) in cheek for 30 minutes and then swallow the night before your appointment 11/13/18 12/12/18   Bing, MD      Allergies    Patient has no known allergies.    Review of Systems   Review of Systems  Cardiovascular:  Positive for chest pain.  All other systems reviewed and are negative.   Physical Exam Updated Vital Signs BP 129/76 (BP Location: Right Arm)   Pulse (!) 48   Temp 98.5 F (36.9 C) (Oral)   Resp 18   Ht 5\' 7"  (1.702 m)   Wt 130.2 kg   SpO2 99%   BMI 44.95 kg/m  Physical Exam Vitals and nursing note reviewed.  Constitutional:      General: She is not in acute distress.  Appearance: She is well-developed.  HENT:     Head: Normocephalic and atraumatic.  Eyes:     Conjunctiva/sclera: Conjunctivae normal.  Cardiovascular:     Rate and Rhythm: Normal rate and regular rhythm.     Heart sounds: No murmur heard. Pulmonary:     Effort: Pulmonary effort is normal. No respiratory distress.     Breath sounds: Normal breath sounds. No wheezing, rhonchi or rales.     Comments: Left lower 11 and 12 rib tenderness to palpation.  No obvious step-off or deformity. Abdominal:     Palpations: Abdomen is soft.     Tenderness: There is abdominal tenderness in the left upper  quadrant. There is left CVA tenderness.  Musculoskeletal:        General: No swelling.     Cervical back: Neck supple.  Skin:    General: Skin is warm and dry.     Capillary Refill: Capillary refill takes less than 2 seconds.  Neurological:     Mental Status: She is alert.  Psychiatric:        Mood and Affect: Mood normal.     ED Results / Procedures / Treatments   Labs (all labs ordered are listed, but only abnormal results are displayed) Labs Reviewed  CBC WITH DIFFERENTIAL/PLATELET - Abnormal; Notable for the following components:      Result Value   WBC 11.7 (*)    Platelets 408 (*)    Lymphs Abs 4.2 (*)    All other components within normal limits  COMPREHENSIVE METABOLIC PANEL - Abnormal; Notable for the following components:   Potassium 3.2 (*)    Glucose, Bld 113 (*)    Calcium 8.8 (*)    All other components within normal limits  LIPASE, BLOOD  PREGNANCY, URINE  URINALYSIS, ROUTINE W REFLEX MICROSCOPIC  MAGNESIUM  TROPONIN I (HIGH SENSITIVITY)    EKG None  Radiology DG Chest 2 View Result Date: 01/16/2023 CLINICAL DATA:  cp EXAM: CHEST - 2 VIEW COMPARISON:  06/15/2022. FINDINGS: Cardiac silhouette is unremarkable. No pneumothorax or pleural effusion. The lungs are clear. The visualized skeletal structures are unremarkable. IMPRESSION: No acute cardiopulmonary process. Electronically Signed   By: Layla Maw M.D.   On: 01/16/2023 18:15    Procedures Procedures    Medications Ordered in ED Medications  ondansetron (ZOFRAN-ODT) disintegrating tablet 8 mg (8 mg Oral Given 01/16/23 1805)  ketorolac (TORADOL) 15 MG/ML injection 15 mg (15 mg Intravenous Given 01/16/23 1811)    ED Course/ Medical Decision Making/ A&P                                 Medical Decision Making Amount and/or Complexity of Data Reviewed Labs: ordered. Radiology: ordered.   This patient presents to the ED for concern of chest pain, this involves an extensive number of  treatment options, and is a complaint that carries with it a high risk of complications and morbidity.  The differential diagnosis includes ACS, PE, pneumothorax, pneumonia, hemothorax, fracture, dislocation, pyelonephritis, nephrolithiasis, cystitis, other   Co morbidities that complicate the patient evaluation  See HPI   Additional history obtained:  Additional history obtained from EMR External records from outside source obtained and reviewed including hospital records   Lab Tests:  I Ordered, and personally interpreted labs.  The pertinent results include: Leukocytosis of 11.7.  No evidence of anemia.  Thrombocytosis of 408.  Other labs pending   Imaging  Studies ordered:  I ordered imaging studies including chest x-ray, CT renal stone I independently visualized and interpreted imaging which showed  Chest x-ray: No acute cardiopulmonary abnormality. CT renal stone: Pending I agree with the radiologist interpretation   Cardiac Monitoring: / EKG:  The patient was maintained on a cardiac monitor.  I personally viewed and interpreted the cardiac monitored which showed an underlying rhythm of: Sinus rhythm with T wave inversion in inferior leads which were present from EKG 6/24   Consultations Obtained:  N/a   Problem List / ED Course / Critical interventions / Medication management  Left flank pain I ordered medication including, Zofran, Toradol Reevaluation of the patient after these medicines showed that the patient improved I have reviewed the patients home medicines and have made adjustments as needed   Social Determinants of Health:  Denies tobacco, illicit drug use   Test / Admission - Considered:  Left flank pain Vitals signs within normal range and stable throughout visit. Laboratory/imaging studies significant for: See above 32 year old female presents emergency department with complaints of left-sided flank/left lower chest pain that occurred when she  was straining to have a bowel movement.  Symptoms seem to resolve and then returned when she was lying down on her bed.  Seem more colicky in nature.  On exam, patient with some tenderness left upper abdomen as well as left-sided CVA with mild tenderness left 11th and 12th rib.  Chest x-ray reassuring.  Awaiting labs and CT imaging for assessment of possible nephrolithiasis given abrupt onset left-sided flank pain and has been somewhat colicky since then versus other intra-abdominal/pelvic abnormality..  It demonstrates change, patient care after PA-C [place].  Patient stable upon shift change.         Final Clinical Impression(s) / ED Diagnoses Final diagnoses:  None    Rx / DC Orders ED Discharge Orders     None         Peter Garter, [place] 01/16/23 1839    Glyn Ade, MD 01/16/23 [phone]

## 2023-05-18 ENCOUNTER — Other Ambulatory Visit: Payer: Self-pay

## 2023-05-18 ENCOUNTER — Emergency Department (HOSPITAL_BASED_OUTPATIENT_CLINIC_OR_DEPARTMENT_OTHER): Payer: MEDICAID

## 2023-05-18 ENCOUNTER — Encounter (HOSPITAL_BASED_OUTPATIENT_CLINIC_OR_DEPARTMENT_OTHER): Payer: Self-pay

## 2023-05-18 DIAGNOSIS — K5900 Constipation, unspecified: Secondary | ICD-10-CM | POA: Diagnosis not present

## 2023-05-18 DIAGNOSIS — R0789 Other chest pain: Secondary | ICD-10-CM | POA: Diagnosis present

## 2023-05-18 DIAGNOSIS — R8271 Bacteriuria: Secondary | ICD-10-CM | POA: Insufficient documentation

## 2023-05-18 DIAGNOSIS — D72829 Elevated white blood cell count, unspecified: Secondary | ICD-10-CM | POA: Diagnosis not present

## 2023-05-18 LAB — CBC WITH DIFFERENTIAL/PLATELET
Abs Immature Granulocytes: 0.08 10*3/uL — ABNORMAL HIGH (ref 0.00–0.07)
Basophils Absolute: 0.1 10*3/uL (ref 0.0–0.1)
Basophils Relative: 1 %
Eosinophils Absolute: 0.2 10*3/uL (ref 0.0–0.5)
Eosinophils Relative: 2 %
HCT: 35.1 % — ABNORMAL LOW (ref 36.0–46.0)
Hemoglobin: 11.4 g/dL — ABNORMAL LOW (ref 12.0–15.0)
Immature Granulocytes: 1 %
Lymphocytes Relative: 26 %
Lymphs Abs: 3.4 10*3/uL (ref 0.7–4.0)
MCH: 27.6 pg (ref 26.0–34.0)
MCHC: 32.5 g/dL (ref 30.0–36.0)
MCV: 85 fL (ref 80.0–100.0)
Monocytes Absolute: 1 10*3/uL (ref 0.1–1.0)
Monocytes Relative: 8 %
Neutro Abs: 8.5 10*3/uL — ABNORMAL HIGH (ref 1.7–7.7)
Neutrophils Relative %: 62 %
Platelets: 369 10*3/uL (ref 150–400)
RBC: 4.13 MIL/uL (ref 3.87–5.11)
RDW: 12.9 % (ref 11.5–15.5)
WBC: 13.3 10*3/uL — ABNORMAL HIGH (ref 4.0–10.5)
nRBC: 0 % (ref 0.0–0.2)

## 2023-05-18 LAB — COMPREHENSIVE METABOLIC PANEL WITH GFR
ALT: 31 U/L (ref 0–44)
AST: 25 U/L (ref 15–41)
Albumin: 3.6 g/dL (ref 3.5–5.0)
Alkaline Phosphatase: 81 U/L (ref 38–126)
Anion gap: 10 (ref 5–15)
BUN: 10 mg/dL (ref 6–20)
CO2: 21 mmol/L — ABNORMAL LOW (ref 22–32)
Calcium: 9 mg/dL (ref 8.9–10.3)
Chloride: 106 mmol/L (ref 98–111)
Creatinine, Ser: 0.92 mg/dL (ref 0.44–1.00)
GFR, Estimated: >60 mL/min (ref >60)
Glucose, Bld: 120 mg/dL — ABNORMAL HIGH (ref 70–99)
Potassium: 3.3 mmol/L — ABNORMAL LOW (ref 3.5–5.1)
Sodium: 137 mmol/L (ref 135–145)
Total Bilirubin: 0.5 mg/dL (ref 0.0–1.2)
Total Protein: 7.4 g/dL (ref 6.5–8.1)

## 2023-05-18 LAB — TROPONIN I (HIGH SENSITIVITY): Troponin I (High Sensitivity): 4 ng/L (ref <18)

## 2023-05-18 LAB — LIPASE, BLOOD: Lipase: 38 U/L (ref 11–51)

## 2023-05-18 LAB — PREGNANCY, URINE: Preg Test, Ur: NEGATIVE

## 2023-05-18 NOTE — ED Triage Notes (Signed)
 Arrives with Legal Guardian who is mother.   Reports LUQ abdominal pain related to a suspected "turd" she wants cut out. Last BM 2 days ago.   Also admits to upper middle chest pain that started just prior to arrival after hyperventilating.

## 2023-05-19 ENCOUNTER — Emergency Department (HOSPITAL_BASED_OUTPATIENT_CLINIC_OR_DEPARTMENT_OTHER): Payer: MEDICAID

## 2023-05-19 ENCOUNTER — Emergency Department (HOSPITAL_BASED_OUTPATIENT_CLINIC_OR_DEPARTMENT_OTHER)
Admission: EM | Admit: 2023-05-19 | Discharge: 2023-05-19 | Disposition: A | Discharge status: Home / Self Care | Payer: MEDICAID | Attending: Emergency Medicine | Admitting: Emergency Medicine

## 2023-05-19 DIAGNOSIS — R8271 Bacteriuria: Secondary | ICD-10-CM

## 2023-05-19 DIAGNOSIS — D72829 Elevated white blood cell count, unspecified: Secondary | ICD-10-CM

## 2023-05-19 DIAGNOSIS — K5900 Constipation, unspecified: Secondary | ICD-10-CM

## 2023-05-19 DIAGNOSIS — R0789 Other chest pain: Secondary | ICD-10-CM

## 2023-05-19 LAB — URINALYSIS, ROUTINE W REFLEX MICROSCOPIC
Bilirubin Urine: NEGATIVE
Glucose, UA: NEGATIVE mg/dL
Ketones, ur: NEGATIVE mg/dL
Leukocytes,Ua: NEGATIVE
Nitrite: NEGATIVE
Protein, ur: NEGATIVE mg/dL
Specific Gravity, Urine: >=1.03 (ref 1.005–1.030)
pH: 5.5 (ref 5.0–8.0)

## 2023-05-19 LAB — URINALYSIS, MICROSCOPIC (REFLEX)

## 2023-05-19 LAB — TROPONIN I (HIGH SENSITIVITY): Troponin I (High Sensitivity): 5 ng/L (ref <18)

## 2023-05-19 LAB — D-DIMER, QUANTITATIVE: D-Dimer, Quant: 0.3 ug{FEU}/mL (ref 0.00–0.50)

## 2023-05-19 MED ORDER — FOSFOMYCIN TROMETHAMINE 3 G PO PACK
3.0000 g | PACK | Freq: Once | ORAL | Status: AC
  Administered 2023-05-19: 3 g via ORAL
  Filled 2023-05-19: qty 3

## 2023-05-19 NOTE — ED Provider Notes (Signed)
 [place] [facility] Provider Note   CSN: [id number] Arrival date & time: 05/18/23  2101     History  Chief Complaint  Patient presents with   Chest Pain    Melissa Griffin is a 33 y.o. female.  The history is provided by the patient.  Chest Pain Melissa Griffin is a 33 y.o. female who presents to the Emergency Department complaining of chest pain and left upper quadrant pain.  She presents to the emergency department accompanied by her mother for evaluation of symptoms that started earlier in the day.  She reported left lower chest wall pain and difficulty breathing with associated left upper quadrant pain.  She feels like she needs to have a bowel movement.  Her last BM was Tuesday.  No fever, leg swelling.  She did vomit once.  She has mild cough.  She has a history of intellectual disability, schizoaffective disorder.  She also has a history of recurrent constipation.  There is a history of blood clots in her father.  She has no personal history of DVT or PE.  She does not take oral contraceptives.     Home Medications Prior to Admission medications   Medication Sig Start Date End Date Taking? Authorizing Provider  acetaminophen (TYLENOL) 500 MG tablet Take 2 tablets (1,000 mg total) by mouth every 8 (eight) hours as needed for mild pain, moderate pain or fever (cramping; 2nd line). 01/17/19   Raynell Caller, MD  cholestyramine (QUESTRAN) 4 g packet Take 1 packet by mouth daily. 04/01/20   [provider]  clonazePAM (KLONOPIN) 1 MG tablet Take 1 mg by mouth at bedtime.    [provider]  cloNIDine (CATAPRES) 0.2 MG tablet Take 0.2 mg by mouth 2 (two) times daily.    [provider]  fluPHENAZine decanoate (PROLIXIN) 25 MG/ML injection Inject 125 mg into the muscle every 30 (thirty) days.    [provider]  FLUVOXAMINE MALEATE PO Take 75 mg by mouth.     [provider]  hydrOXYzine  (ATARAX/VISTARIL) 25 MG tablet Take 1 tablet (25 mg total) by mouth 3 (three) times daily as needed. 12/12/18   Stephany Ehrich, MD  traZODone (DESYREL) 150 MG tablet Take by mouth at bedtime.    [provider]  Valbenazine Tosylate (INGREZZA) 40 MG CAPS Take 40 mg by mouth at bedtime.    [provider]  Vitamin D, Cholecalciferol, 10 MCG (400 UNIT) TABS Take 50 mcg by mouth in the morning and at bedtime.     Lindner, Jodi N, RN  misoprostol (CYTOTEC) 200 MCG tablet Place two tabs (400mcg) in cheek for 30 minutes and then swallow the night before your appointment 11/13/18 12/12/18  Raynell Caller, MD      Allergies    Patient has no known allergies.    Review of Systems   Review of Systems  Cardiovascular:  Positive for chest pain.  All other systems reviewed and are negative.   Physical Exam Updated Vital Signs BP 117/63   Pulse 85   Temp 98.3 F (36.8 C)   Resp 16   Ht 5\' 7"  (1.702 m)   Wt 130.2 kg   SpO2 100%   BMI 44.95 kg/m  Physical Exam Vitals and nursing note reviewed.  Constitutional:      Appearance: She is well-developed.  HENT:     Head: Normocephalic and atraumatic.  Cardiovascular:     Rate and Rhythm: Normal rate and regular  rhythm.     Heart sounds: No murmur heard. Pulmonary:     Effort: Pulmonary effort is normal. No respiratory distress.     Breath sounds: Normal breath sounds.  Abdominal:     Palpations: Abdomen is soft.     Tenderness: There is no abdominal tenderness. There is no guarding or rebound.  Musculoskeletal:        General: No swelling or tenderness.  Skin:    General: Skin is warm and dry.  Neurological:     Mental Status: She is alert and oriented to person, place, and time.  Psychiatric:        Behavior: Behavior normal.     ED Results / Procedures / Treatments   Labs (all labs ordered are listed, but only abnormal results are displayed) Labs Reviewed  COMPREHENSIVE METABOLIC PANEL WITH GFR -  Abnormal; Notable for the following components:      Result Value   Potassium 3.3 (*)    CO2 21 (*)    Glucose, Bld 120 (*)    All other components within normal limits  CBC WITH DIFFERENTIAL/PLATELET - Abnormal; Notable for the following components:   WBC 13.3 (*)    Hemoglobin 11.4 (*)    HCT 35.1 (*)    Neutro Abs 8.5 (*)    Abs Immature Granulocytes 0.08 (*)    All other components within normal limits  URINALYSIS, ROUTINE W REFLEX MICROSCOPIC - Abnormal; Notable for the following components:   APPearance TURBID (*)    Hgb urine dipstick SMALL (*)    All other components within normal limits  URINALYSIS, MICROSCOPIC (REFLEX) - Abnormal; Notable for the following components:   Bacteria, UA MANY (*)    All other components within normal limits  PREGNANCY, URINE  LIPASE, BLOOD  D-DIMER, QUANTITATIVE  TROPONIN I (HIGH SENSITIVITY)  TROPONIN I (HIGH SENSITIVITY)    EKG EKG Interpretation Date/Time:  Wednesday May 18 2023 21:10:48 EDT Ventricular Rate:  106 PR Interval:  135 QRS Duration:  90 QT Interval:  339 QTC Calculation: 451 R Axis:   50  Text Interpretation: Sinus tachycardia Ventricular bigeminy Borderline repolarization abnormality Confirmed by Kelsey Patricia [phone]) on 05/19/2023 12:34:41 AM  Radiology DG Abdomen 1 View Result Date: 05/19/2023 CLINICAL DATA:  Abdominal pain, constipation EXAM: ABDOMEN - 1 VIEW COMPARISON:  CT 01/16/2023 FINDINGS: There is a non obstructive bowel gas pattern. No supine evidence of free air. No organomegaly or suspicious calcification. No acute bony abnormality. Moderate stool burden throughout the colon. IMPRESSION: Prior cholecystectomy.  Moderate stool burden.  No acute findings. Electronically Signed   By: Janeece Mechanic M.D.   On: 05/19/2023 01:54   DG Chest 2 View Result Date: 05/18/2023 CLINICAL DATA:  Chest pain and left upper quadrant pain, initial encounter EXAM: CHEST - 2 VIEW COMPARISON:  02/07/2018 FINDINGS: The heart  size and mediastinal contours are within normal limits. Both lungs are clear. The visualized skeletal structures are unremarkable. IMPRESSION: No active cardiopulmonary disease. Electronically Signed   By: Violeta Grey M.D.   On: 05/18/2023 21:31    Procedures Procedures    Medications Ordered in ED Medications  fosfomycin (MONUROL) packet 3 g (3 g Oral Given 05/19/23 8469)    ED Course/ Medical Decision Making/ A&P                                 Medical Decision Making Amount and/or Complexity of Data Reviewed  Labs: ordered. Radiology: ordered.  Risk Prescription drug management.   Patient here for evaluation of left lower chest pain, left upper quadrant pain, history of similar symptoms in the past secondary to constipation.  She giggles on examination and does not have any appreciable abdominal tenderness.  Lungs are clear with no respiratory distress.  Opponent's are negative x 2 and EKG is nonischemic.  D-dimer is negative.  Presentation is not consistent with PE, ACS, dissection.  CBC does have mild leukocytosis.  Plain film of the abdomen demonstrates moderate stool burden.  Presentation is not consistent with perforated viscus, pancreatitis, diverticulitis, serious intra-abdominal process.  UA does have many bacteria, no significant leukocytes.  Presentation is not consistent with Pilo or renal colic.  Will treat with fosfomycin given her bacteriuria.  Plan to discharge with treatment for constipation.  She may take acetaminophen or ibuprofen for pain.  Return precautions discussed.        Final Clinical Impression(s) / ED Diagnoses Final diagnoses:  Atypical chest pain  Constipation, unspecified constipation type  Bacteriuria  Leukocytosis, unspecified type    Rx / DC Orders ED Discharge Orders     None         Kelsey Patricia, MD 05/19/23 [phone]

## 2023-07-24 ENCOUNTER — Encounter (HOSPITAL_BASED_OUTPATIENT_CLINIC_OR_DEPARTMENT_OTHER): Payer: Self-pay | Admitting: Emergency Medicine

## 2023-07-24 ENCOUNTER — Emergency Department (HOSPITAL_BASED_OUTPATIENT_CLINIC_OR_DEPARTMENT_OTHER): Payer: MEDICAID

## 2023-07-24 ENCOUNTER — Other Ambulatory Visit: Payer: Self-pay

## 2023-07-24 ENCOUNTER — Emergency Department (HOSPITAL_BASED_OUTPATIENT_CLINIC_OR_DEPARTMENT_OTHER)
Admission: EM | Admit: 2023-07-24 | Discharge: 2023-07-24 | Disposition: A | Discharge status: Home / Self Care | Payer: MEDICAID

## 2023-07-24 DIAGNOSIS — R079 Chest pain, unspecified: Secondary | ICD-10-CM | POA: Diagnosis present

## 2023-07-24 DIAGNOSIS — R002 Palpitations: Secondary | ICD-10-CM | POA: Diagnosis not present

## 2023-07-24 LAB — CBC WITH DIFFERENTIAL/PLATELET
Abs Immature Granulocytes: 0.03 10*3/uL (ref 0.00–0.07)
Basophils Absolute: 0.1 10*3/uL (ref 0.0–0.1)
Basophils Relative: 1 %
Eosinophils Absolute: 0.2 10*3/uL (ref 0.0–0.5)
Eosinophils Relative: 2 %
HCT: 36.2 % (ref 36.0–46.0)
Hemoglobin: 11.7 g/dL — ABNORMAL LOW (ref 12.0–15.0)
Immature Granulocytes: 0 %
Lymphocytes Relative: 34 %
Lymphs Abs: 3.9 10*3/uL (ref 0.7–4.0)
MCH: 27.3 pg (ref 26.0–34.0)
MCHC: 32.3 g/dL (ref 30.0–36.0)
MCV: 84.4 fL (ref 80.0–100.0)
Monocytes Absolute: 0.6 10*3/uL (ref 0.1–1.0)
Monocytes Relative: 6 %
Neutro Abs: 6.5 10*3/uL (ref 1.7–7.7)
Neutrophils Relative %: 57 %
Platelets: 450 10*3/uL — ABNORMAL HIGH (ref 150–400)
RBC: 4.29 MIL/uL (ref 3.87–5.11)
RDW: 13.2 % (ref 11.5–15.5)
WBC: 11.3 10*3/uL — ABNORMAL HIGH (ref 4.0–10.5)
nRBC: 0 % (ref 0.0–0.2)

## 2023-07-24 LAB — BASIC METABOLIC PANEL WITH GFR
Anion gap: 13 (ref 5–15)
BUN: 7 mg/dL (ref 6–20)
CO2: 22 mmol/L (ref 22–32)
Calcium: 9.3 mg/dL (ref 8.9–10.3)
Chloride: 105 mmol/L (ref 98–111)
Creatinine, Ser: 1.07 mg/dL — ABNORMAL HIGH (ref 0.44–1.00)
GFR, Estimated: >60 mL/min (ref >60)
Glucose, Bld: 136 mg/dL — ABNORMAL HIGH (ref 70–99)
Potassium: 3.7 mmol/L (ref 3.5–5.1)
Sodium: 141 mmol/L (ref 135–145)

## 2023-07-24 LAB — PREGNANCY, URINE: Preg Test, Ur: NEGATIVE

## 2023-07-24 LAB — D-DIMER, QUANTITATIVE: D-Dimer, Quant: <0.27 ug{FEU}/mL (ref 0.00–0.50)

## 2023-07-24 LAB — TROPONIN T, HIGH SENSITIVITY: Troponin T High Sensitivity: <15 ng/L (ref <19)

## 2023-07-24 MED ORDER — LACTATED RINGERS IV BOLUS
1000.0000 mL | Freq: Once | INTRAVENOUS | Status: AC
  Administered 2023-07-24: 1000 mL via INTRAVENOUS

## 2023-07-24 NOTE — ED Provider Notes (Signed)
 [place] [facility] Provider Note   CSN: [id number] Arrival date & time: 07/24/23  8170     Patient presents with: Chest Pain   Melissa Griffin is a 33 y.o. female.   33 year old female with past medical history of schizoaffective disorder and GERD presenting to the emergency department today with chest pain.  The patient states she has been having chest pain and palpitations down over the past 2 days.  She states that since she has arrived to the emergency department that the symptoms have resolved.  She denies any cough or fevers.  She came to the ER today for further evaluation regarding this due to ongoing symptoms.   Chest Pain      Prior to Admission medications   Medication Sig Start Date End Date Taking? Authorizing Provider  acetaminophen  (TYLENOL ) 500 MG tablet Take 2 tablets (1,000 mg total) by mouth every 8 (eight) hours as needed for mild pain, moderate pain or fever (cramping; 2nd line). 01/17/19   Izell Harari, MD  cholestyramine (QUESTRAN) 4 g packet Take 1 packet by mouth daily. 04/01/20   [provider]  clonazePAM (KLONOPIN) 1 MG tablet Take 1 mg by mouth at bedtime.    [provider]  cloNIDine (CATAPRES) 0.2 MG tablet Take 0.2 mg by mouth 2 (two) times daily.    [provider]  fluPHENAZine decanoate (PROLIXIN) 25 MG/ML injection Inject 125 mg into the muscle every 30 (thirty) days.    [provider]  FLUVOXAMINE MALEATE PO Take 75 mg by mouth.     [provider]  hydrOXYzine  (ATARAX /VISTARIL ) 25 MG tablet Take 1 tablet (25 mg total) by mouth 3 (three) times daily as needed. 12/12/18   Maranda Jamee Jacob, MD  traZODone (DESYREL) 150 MG tablet Take by mouth at bedtime.    [provider]  Valbenazine Tosylate (INGREZZA) 40 MG CAPS Take 40 mg by mouth at bedtime.    [provider]  Vitamin D, Cholecalciferol, 10 MCG (400 UNIT) TABS Take 50 mcg by mouth in  the morning and at bedtime.     Curt Myla SAILOR, RN  misoprostol  (CYTOTEC ) 200 MCG tablet Place two tabs (400mcg) in cheek for 30 minutes and then swallow the night before your appointment 11/13/18 12/12/18  Izell Harari, MD    Allergies: Patient has no known allergies.    Review of Systems  Cardiovascular:  Positive for chest pain.  All other systems reviewed and are negative.   Updated Vital Signs BP 113/78   Pulse 76   Temp (!) 97.2 F (36.2 C) (Tympanic)   Resp (!) 22   Ht 5' 7 (1.702 m)   Wt 130.2 kg   SpO2 100%   BMI 44.96 kg/m   Physical Exam Vitals and nursing note reviewed.   Gen: NAD Eyes: PERRL, EOMI HEENT: no oropharyngeal swelling Neck: trachea midline Resp: clear to auscultation bilaterally Card: RRR, no murmurs, rubs, or gallops Abd: nontender, nondistended Extremities: no calf tenderness, no edema Vascular: 2+ radial pulses bilaterally, 2+ DP pulses bilaterally Neuro: No focal deficits Skin: no rashes Psyc: acting appropriately   (all labs ordered are listed, but only abnormal results are displayed) Labs Reviewed  BASIC METABOLIC PANEL WITH GFR - Abnormal; Notable for the following components:      Result Value   Glucose, Bld 136 (*)    Creatinine, Ser 1.07 (*)    All other components within normal limits  CBC WITH DIFFERENTIAL/PLATELET - Abnormal;  Notable for the following components:   WBC 11.3 (*)    Hemoglobin 11.7 (*)    Platelets 450 (*)    All other components within normal limits  PREGNANCY, URINE  D-DIMER, QUANTITATIVE  TROPONIN T, HIGH SENSITIVITY    EKG: EKG Interpretation Date/Time:  Sunday July 24 2023 18:38:14 EDT Ventricular Rate:  110 PR Interval:  134 QRS Duration:  84 QT Interval:  321 QTC Calculation: 435 R Axis:   48  Text Interpretation: Sinus tachycardia Nonspecific T abnormalities, diffuse leads Confirmed by Ula Barter [phone]) on 07/24/2023 6:39:57 PM  Radiology: [facility] Chest 2 View Result Date:  07/24/2023 CLINICAL DATA:  Chest pain EXAM: CHEST - 2 VIEW COMPARISON:  05/18/2023 FINDINGS: The heart size and mediastinal contours are within normal limits. Both lungs are clear. The visualized skeletal structures are unremarkable. IMPRESSION: No active cardiopulmonary disease. Electronically Signed   By: Oneil Devonshire M.D.   On: 07/24/2023 19:52     Procedures   Medications Ordered in the ED  lactated ringers  bolus 1,000 mL (0 mLs Intravenous Stopped 07/24/23 2136)                                    Medical Decision Making 33 year old female with past medical history of GERD and schizoaffective disorder presenting to the emergency department today with chest pain.  I will further evaluate patient here with basic labs Wels and EKG, chest x-ray, and troponin for further evaluation for ACS, pulmonary edema, pulmonary infiltrates, or pneumothorax.  I will keep the patient on the cardiac monitor.  I will reevaluate for ultimate disposition.  Given her tachycardia on arrival will obtain a D-dimer to evaluate for pulmonary embolism and give her IV fluids.  I will reevaluate for ultimate disposition.  The patient's EKG is nonischemic.  Troponins are negative.  D-dimer is unremarkable.  The patient is discharged with return precautions.  Amount and/or Complexity of Data Reviewed Labs: ordered. Radiology: ordered.        Final diagnoses:  Chest pain, unspecified type    ED Discharge Orders     None          Ula Barter SAUNDERS, MD 07/24/23 2157

## 2023-07-24 NOTE — ED Triage Notes (Signed)
 Pt c/o CP across upper chest since last night; sts feels like her heart is beating fast; denies other sxs

## 2023-07-24 NOTE — ED Notes (Signed)
 Pt provided discharge instructions and prescription information. Pt was given the opportunity to ask questions and questions were answered.

## 2023-07-24 NOTE — Discharge Instructions (Signed)
 Your workup today was reassuring.  Please take Tylenol  or ibuprofen  at home as needed for pain.  Follow-up with your doctor and return to the ER for worsening symptoms.

## 2023-12-06 ENCOUNTER — Other Ambulatory Visit: Payer: Self-pay

## 2023-12-06 ENCOUNTER — Emergency Department (HOSPITAL_BASED_OUTPATIENT_CLINIC_OR_DEPARTMENT_OTHER)
Admission: EM | Admit: 2023-12-06 | Discharge: 2023-12-06 | Disposition: A | Discharge status: Home / Self Care | Payer: MEDICAID | Attending: Emergency Medicine | Admitting: Emergency Medicine

## 2023-12-06 ENCOUNTER — Emergency Department (HOSPITAL_BASED_OUTPATIENT_CLINIC_OR_DEPARTMENT_OTHER): Payer: MEDICAID

## 2023-12-06 DIAGNOSIS — I498 Other specified cardiac arrhythmias: Secondary | ICD-10-CM

## 2023-12-06 DIAGNOSIS — J40 Bronchitis, not specified as acute or chronic: Secondary | ICD-10-CM | POA: Diagnosis not present

## 2023-12-06 DIAGNOSIS — R0602 Shortness of breath: Secondary | ICD-10-CM | POA: Diagnosis present

## 2023-12-06 DIAGNOSIS — R062 Wheezing: Secondary | ICD-10-CM

## 2023-12-06 LAB — BASIC METABOLIC PANEL WITH GFR
Anion gap: 13 (ref 5–15)
BUN: 6 mg/dL (ref 6–20)
CO2: 22 mmol/L (ref 22–32)
Calcium: 9 mg/dL (ref 8.9–10.3)
Chloride: 105 mmol/L (ref 98–111)
Creatinine, Ser: 0.91 mg/dL (ref 0.44–1.00)
GFR, Estimated: >60 mL/min (ref >60)
Glucose, Bld: 150 mg/dL — ABNORMAL HIGH (ref 70–99)
Potassium: 3.5 mmol/L (ref 3.5–5.1)
Sodium: 140 mmol/L (ref 135–145)

## 2023-12-06 LAB — CBC
HCT: 37.7 % (ref 36.0–46.0)
Hemoglobin: 12.2 g/dL (ref 12.0–15.0)
MCH: 27.1 pg (ref 26.0–34.0)
MCHC: 32.4 g/dL (ref 30.0–36.0)
MCV: 83.6 fL (ref 80.0–100.0)
Platelets: 364 K/uL (ref 150–400)
RBC: 4.51 MIL/uL (ref 3.87–5.11)
RDW: 13.4 % (ref 11.5–15.5)
WBC: 11.3 K/uL — ABNORMAL HIGH (ref 4.0–10.5)
nRBC: 0 % (ref 0.0–0.2)

## 2023-12-06 LAB — TROPONIN T, HIGH SENSITIVITY: Troponin T High Sensitivity: <15 ng/L (ref 0–19)

## 2023-12-06 MED ORDER — IPRATROPIUM-ALBUTEROL 0.5-2.5 (3) MG/3ML IN SOLN
3.0000 mL | Freq: Once | RESPIRATORY_TRACT | Status: AC
  Administered 2023-12-06: 3 mL via RESPIRATORY_TRACT

## 2023-12-06 MED ORDER — AEROCHAMBER PLUS FLO-VU MEDIUM MISC
1.0000 | Freq: Once | Status: AC
  Administered 2023-12-06: 1

## 2023-12-06 MED ORDER — ALBUTEROL SULFATE (2.5 MG/3ML) 0.083% IN NEBU
2.5000 mg | INHALATION_SOLUTION | Freq: Once | RESPIRATORY_TRACT | Status: AC
  Administered 2023-12-06: 2.5 mg via RESPIRATORY_TRACT
  Filled 2023-12-06: qty 3

## 2023-12-06 MED ORDER — ALBUTEROL SULFATE (2.5 MG/3ML) 0.083% IN NEBU
2.5000 mg | INHALATION_SOLUTION | Freq: Once | RESPIRATORY_TRACT | Status: AC
  Administered 2023-12-06: 2.5 mg via RESPIRATORY_TRACT

## 2023-12-06 MED ORDER — ALBUTEROL SULFATE HFA 108 (90 BASE) MCG/ACT IN AERS: 1.0000 | INHALATION_SPRAY | Freq: Four times a day (QID) | RESPIRATORY_TRACT | 0 refills | Status: AC | PRN

## 2023-12-06 MED ORDER — ALBUTEROL SULFATE HFA 108 (90 BASE) MCG/ACT IN AERS
1.0000 | INHALATION_SPRAY | Freq: Once | RESPIRATORY_TRACT | Status: AC
  Administered 2023-12-06: 1 via RESPIRATORY_TRACT
  Filled 2023-12-06: qty 6.7

## 2023-12-06 MED ORDER — IPRATROPIUM-ALBUTEROL 0.5-2.5 (3) MG/3ML IN SOLN
3.0000 mL | Freq: Once | RESPIRATORY_TRACT | Status: AC
  Administered 2023-12-06: 3 mL via RESPIRATORY_TRACT
  Filled 2023-12-06: qty 3

## 2023-12-06 MED ORDER — METHYLPREDNISOLONE SODIUM SUCC 125 MG IJ SOLR
125.0000 mg | Freq: Once | INTRAMUSCULAR | Status: AC
  Administered 2023-12-06: 125 mg via INTRAVENOUS
  Filled 2023-12-06: qty 2

## 2023-12-06 MED ORDER — METHYLPREDNISOLONE 4 MG PO TBPK: ORAL_TABLET | ORAL | 0 refills | Status: AC

## 2023-12-06 NOTE — ED Triage Notes (Signed)
 Pt with mother- reports shob since Sunday, seen at PCP yesterday.   Pt c/o chest pain started 2 hours ago.   RT to triage to assess.

## 2023-12-06 NOTE — ED Provider Notes (Signed)
 [place] [facility] Provider Note   CSN: [id number] Arrival date & time: 12/06/23  2135    Patient presents with: Shortness of Breath   Melissa Griffin is a 33 y.o. female here for evaluation of shortness of breath and chest tightness which began Sunday.  Associated nonproductive cough.  Was seen by PCP yesterday had wheeze was started on antihistamine.  Was prescribed azithromycin however was told to hold off on taking this for a few days in case this was a viral infection.  Wheezing worsened tonight.  Does not have diagnosis of asthma.  No pain or swelling to lower legs.  Denies any overt chest pain however states it feels tight like she cannot take a deep breath in.  No fevers at home however has had some chills.  Cough is nonproductive.  States she cannot cough it out.  No hemoptysis.  No history of PE or DVT.  No exertional chest pain.  No recent surgery, immobilization or malignancy.  No palpitations, syncope.  Associated congestion and clear rhinorrhea.    Patient here with family who helps provide history given patient's history of intellectual disability.   HPI     Prior to Admission medications   Medication Sig Start Date End Date Taking? Authorizing Provider  albuterol  (VENTOLIN  HFA) 108 (90 Base) MCG/ACT inhaler Inhale 1-2 puffs into the lungs every 6 (six) hours as needed for wheezing or shortness of breath. 12/06/23  Yes Oronde Hallenbeck A, PA-C  methylPREDNISolone (MEDROL DOSEPAK) 4 MG TBPK tablet Take as prescribed on the box 12/06/23  Yes Mulan Adan A, PA-C  acetaminophen  (TYLENOL ) 500 MG tablet Take 2 tablets (1,000 mg total) by mouth every 8 (eight) hours as needed for mild pain, moderate pain or fever (cramping; 2nd line). 01/17/19   Izell Harari, MD  cholestyramine (QUESTRAN) 4 g packet Take 1 packet by mouth daily. 04/01/20   [provider]  clonazePAM (KLONOPIN) 1 MG tablet Take 1 mg by mouth at bedtime.     [provider]  cloNIDine (CATAPRES) 0.2 MG tablet Take 0.2 mg by mouth 2 (two) times daily.    [provider]  fluPHENAZine decanoate (PROLIXIN) 25 MG/ML injection Inject 125 mg into the muscle every 30 (thirty) days.    [provider]  FLUVOXAMINE MALEATE PO Take 75 mg by mouth.     [provider]  hydrOXYzine  (ATARAX /VISTARIL ) 25 MG tablet Take 1 tablet (25 mg total) by mouth 3 (three) times daily as needed. 12/12/18   Maranda Jamee Jacob, MD  traZODone (DESYREL) 150 MG tablet Take by mouth at bedtime.    [provider]  Valbenazine Tosylate (INGREZZA) 40 MG CAPS Take 40 mg by mouth at bedtime.    [provider]  Vitamin D, Cholecalciferol, 10 MCG (400 UNIT) TABS Take 50 mcg by mouth in the morning and at bedtime.     Curt Myla SAILOR, RN  misoprostol  (CYTOTEC ) 200 MCG tablet Place two tabs (400mcg) in cheek for 30 minutes and then swallow the night before your appointment 11/13/18 12/12/18  Izell Harari, MD    Allergies: Patient has no known allergies.    Review of Systems  Constitutional:  Positive for activity change, chills and fatigue.  HENT:  Positive for congestion and rhinorrhea. Negative for sore throat.   Respiratory:  Positive for cough, shortness of breath and wheezing.   Cardiovascular: Negative.   Gastrointestinal: Negative.   Genitourinary: Negative.   Musculoskeletal: Negative.   Skin:  Negative.   Neurological: Negative.   All other systems reviewed and are negative.   Updated Vital Signs BP (!) 136/105   Pulse (!) 103   Temp 97.7 F (36.5 C)   Resp (!) 24   Ht 5' 7 (1.702 m)   Wt 127 kg   SpO2 96%   BMI 43.85 kg/m   Physical Exam Vitals and nursing note reviewed.  Constitutional:      General: She is not in acute distress.    Appearance: She is well-developed. She is not ill-appearing, toxic-appearing or diaphoretic.  HENT:     Head: Normocephalic and atraumatic.  Eyes:     Pupils: Pupils  are equal, round, and reactive to light.  Cardiovascular:     Rate and Rhythm: Normal rate.     Pulses: Normal pulses.          Radial pulses are 2+ on the right side and 2+ on the left side.       Dorsalis pedis pulses are 2+ on the right side and 2+ on the left side.     Heart sounds: Normal heart sounds.  Pulmonary:     Effort: No respiratory distress.     Breath sounds: Wheezing present.     Comments: Diffuse expiratory wheeze on exam Chest:     Chest wall: Tenderness present.     Comments: Mild anterior tenderness chest wall without crepitus or step-off Abdominal:     General: Bowel sounds are normal. There is no distension.     Palpations: Abdomen is soft.     Tenderness: There is no abdominal tenderness.     Comments: Soft, nontender, no rebound or guarding  Musculoskeletal:        General: Normal range of motion.     Cervical back: Normal range of motion.     Right lower leg: No tenderness. No edema.     Left lower leg: No tenderness. No edema.     Comments: No bony tenderness, compartments soft, full range of motion  Skin:    General: Skin is warm and dry.     Capillary Refill: Capillary refill takes less than 2 seconds.  Neurological:     General: No focal deficit present.     Mental Status: She is alert.  Psychiatric:        Mood and Affect: Mood normal.     (all labs ordered are listed, but only abnormal results are displayed) Labs Reviewed  BASIC METABOLIC PANEL WITH GFR - Abnormal; Notable for the following components:      Result Value   Glucose, Bld 150 (*)    All other components within normal limits  CBC - Abnormal; Notable for the following components:   WBC 11.3 (*)    All other components within normal limits  PREGNANCY, URINE  TROPONIN T, HIGH SENSITIVITY    EKG: EKG Interpretation Date/Time:  Tuesday December 06 2023 21:43:22 EST Ventricular Rate:  130 PR Interval:  132 QRS Duration:  81 QT Interval:  329 QTC Calculation: 396 R  Axis:   59  Text Interpretation: Sinus tachycardia new Ventricular bigeminy Abnormal T, consider ischemia, diffuse leads   Confirmed by Doretha Folks ([id number]) on 12/06/2023 10:42:20 PM  Radiology: [facility] Chest 2 View Result Date: 12/06/2023 EXAM: 2 VIEW(S) XRAY OF THE CHEST 12/06/2023 10:01:26 PM COMPARISON: 07/24/2023 CLINICAL HISTORY: chest pain FINDINGS: LUNGS AND PLEURA: No focal pulmonary opacity. No pulmonary edema. No pleural effusion. No pneumothorax. HEART AND MEDIASTINUM: No acute abnormality  of the cardiac and mediastinal silhouettes. BONES AND SOFT TISSUES: No acute osseous abnormality. IMPRESSION: 1. No acute cardiopulmonary process. Electronically signed by: Franky Crease MD 12/06/2023 10:08 PM EST RP Workstation: [id number]     Procedures   Medications Ordered in the ED  albuterol  (VENTOLIN  HFA) 108 (90 Base) MCG/ACT inhaler 1 puff (has no administration in time range)  AeroChamber Plus Flo-Vu Medium MISC 1 each (has no administration in time range)  ipratropium-albuterol  (DUONEB) 0.5-2.5 (3) MG/3ML nebulizer solution 3 mL (3 mLs Nebulization Given 12/06/23 2158)  albuterol  (PROVENTIL ) (2.5 MG/3ML) 0.083% nebulizer solution 2.5 mg (2.5 mg Nebulization Given 12/06/23 2158)  methylPREDNISolone sodium succinate (SOLU-MEDROL) 125 mg/2 mL injection 125 mg (125 mg Intravenous Given 12/06/23 2229)  albuterol  (PROVENTIL ) (2.5 MG/3ML) 0.083% nebulizer solution 2.5 mg (2.5 mg Nebulization Given 12/06/23 2234)  ipratropium-albuterol  (DUONEB) 0.5-2.5 (3) MG/3ML nebulizer solution 3 mL (3 mLs Nebulization Given 12/06/23 1735)   33 year old multiple medical comorbidities here for evaluation of wheeze and chest tightness.  Symptoms started on Sunday.  Seen by PCP yesterday thought likely viral illness however given azithromycin told to hold off for a few days to see if she improved with some over-the-counter medications.  Shortness of breath worsened this evening.  She denies any exertional or  pleuritic chest pain.  Does not appear grossly fluid overloaded.  No history of PE, DVT, recent surgery, immobilization or malignancy.  Here she has some congestion, rhinorrhea, expiratory wheeze.  Will plan on labs, imaging, reassess.  Seen by RT in triage, ordered nebulizers.  Labs and imaging personally viewed and interpreted:  CBC leukocytosis 11.3 Metabolic panel glucose 150 Troponin less than 15--- given symptoms over the last 3 days with suspect troponin will be elevated at this time if patient had acute ACS Chest x-ray without cardiomegaly, pulm edema, pneumothorax, infiltrates EKG shows bigeminy  Patient did have a run of bigeminy, PVC after breathing treatment.  Ultimately went back into normal sinus rhythm.  Patient reassessed after multiple nebulizers.  Feels improved.  No hypoxia.  Lungs clear to discharge.  Suspect she likely has bronchitis, viral illness.  She already has azithromycin prescribed by PCP.  Will add on Medrol Dosepak, albuterol.  Low suspicion for acute ACS, unstable angina, PE, dissection, pneumothorax, arrhythmia requiring admission.  Will have follow-up with PCP tomorrow, return for new or worsening symptoms.                                  Medical Decision Making Amount and/or Complexity of Data Reviewed Independent Historian: guardian    Details: Mother- Rebecca Leder External Data Reviewed: labs, radiology, ECG and notes. Labs: ordered. Decision-making details documented in ED Course. Radiology: ordered and independent interpretation performed. Decision-making details documented in ED Course. ECG/medicine tests: ordered and independent interpretation performed. Decision-making details documented in ED Course.  Risk OTC drugs. Prescription drug management. Decision regarding hospitalization. Diagnosis or treatment significantly limited by social determinants of health.        Final diagnoses:  Wheeze  Bronchitis  Bigeminy    ED  Discharge Orders          Ordered    methylPREDNISolone (MEDROL DOSEPAK) 4 MG TBPK tablet        12/06/23 2326    albuterol (VENTOLIN HFA) 108 (90 Base) MCG/ACT inhaler  Every 6 hours PRN        11 /04/25 2326  Yuya Vanwingerden A, PA-C 12/06/23 2334    Doretha Folks, MD 12/09/23 [phone]

## 2023-12-06 NOTE — Discharge Instructions (Addendum)
 It was a pleasure taking care of Melissa Griffin today  Work today was reassuring.  She did have a wheeze.  Treating with steroids at home as well as albuterol  inhaler.  Start taking the azithromycin pack prescribed at the primary care doctor.  Her heart rate was a little elevated here in a rhythm called bigeminy for a period of time.  Make sure to follow-up with her primary care doctor for this she may need to see cardiology.  Return for any new or worsening symptoms otherwise follow-up with primary care doctor in 1 to 2 days for reevaluation
